# Patient Record
Sex: Male | Born: 1950 | Race: White | Hispanic: No | Marital: Married | Smoking: Current every day smoker
Health system: Southern US, Community
[De-identification: ages and names within clinical notes are randomized; demographics above are authoritative.]

## PROBLEM LIST (undated history)

## (undated) ENCOUNTER — Emergency Department (HOSPITAL_BASED_OUTPATIENT_CLINIC_OR_DEPARTMENT_OTHER): Admission: EM | Disposition: A | Discharge status: Home / Self Care | Payer: PRIVATE HEALTH INSURANCE

## (undated) DIAGNOSIS — I4891 Unspecified atrial fibrillation: Secondary | ICD-10-CM

## (undated) DIAGNOSIS — I340 Nonrheumatic mitral (valve) insufficiency: Secondary | ICD-10-CM

## (undated) DIAGNOSIS — I2510 Atherosclerotic heart disease of native coronary artery without angina pectoris: Secondary | ICD-10-CM

## (undated) DIAGNOSIS — I10 Essential (primary) hypertension: Secondary | ICD-10-CM

## (undated) DIAGNOSIS — M545 Low back pain, unspecified: Secondary | ICD-10-CM

## (undated) DIAGNOSIS — Z9889 Other specified postprocedural states: Secondary | ICD-10-CM

## (undated) DIAGNOSIS — E785 Hyperlipidemia, unspecified: Secondary | ICD-10-CM

## (undated) DIAGNOSIS — E669 Obesity, unspecified: Secondary | ICD-10-CM

## (undated) DIAGNOSIS — IMO0002 Reserved for concepts with insufficient information to code with codable children: Secondary | ICD-10-CM

## (undated) DIAGNOSIS — R9430 Abnormal result of cardiovascular function study, unspecified: Secondary | ICD-10-CM

## (undated) DIAGNOSIS — Z720 Tobacco use: Secondary | ICD-10-CM

## (undated) DIAGNOSIS — K922 Gastrointestinal hemorrhage, unspecified: Secondary | ICD-10-CM

## (undated) HISTORY — DX: Abnormal result of cardiovascular function study, unspecified: R94.30

## (undated) HISTORY — DX: Essential (primary) hypertension: I10

## (undated) HISTORY — DX: Tobacco use: Z72.0

## (undated) HISTORY — DX: Obesity, unspecified: E66.9

## (undated) HISTORY — PX: TONSILLECTOMY: SUR1361

## (undated) HISTORY — DX: Unspecified atrial fibrillation: I48.91

## (undated) HISTORY — DX: Nonrheumatic mitral (valve) insufficiency: I34.0

## (undated) HISTORY — PX: COLONOSCOPY: SHX174

## (undated) HISTORY — DX: Low back pain: M54.5

## (undated) HISTORY — DX: Other specified postprocedural states: Z98.890

## (undated) HISTORY — PX: OTHER SURGICAL HISTORY: SHX169

## (undated) HISTORY — DX: Hyperlipidemia, unspecified: E78.5

## (undated) HISTORY — DX: Atherosclerotic heart disease of native coronary artery without angina pectoris: I25.10

## (undated) HISTORY — DX: Low back pain, unspecified: M54.50

## (undated) HISTORY — DX: Gastrointestinal hemorrhage, unspecified: K92.2

## (undated) HISTORY — DX: Reserved for concepts with insufficient information to code with codable children: IMO0002

---

## 1998-01-16 ENCOUNTER — Emergency Department (HOSPITAL_COMMUNITY): Admission: EM | Admit: 1998-01-16 | Discharge: 1998-01-16 | Payer: Self-pay

## 1998-01-18 ENCOUNTER — Ambulatory Visit (HOSPITAL_BASED_OUTPATIENT_CLINIC_OR_DEPARTMENT_OTHER): Admission: RE | Admit: 1998-01-18 | Discharge: 1998-01-18 | Payer: Self-pay | Admitting: General Surgery

## 2000-08-10 ENCOUNTER — Ambulatory Visit (HOSPITAL_COMMUNITY): Admission: RE | Admit: 2000-08-10 | Discharge: 2000-08-10 | Payer: Self-pay | Admitting: Cardiology

## 2003-02-18 ENCOUNTER — Emergency Department (HOSPITAL_COMMUNITY): Admission: AD | Admit: 2003-02-18 | Discharge: 2003-02-18 | Payer: Self-pay | Admitting: Emergency Medicine

## 2003-06-19 ENCOUNTER — Ambulatory Visit (HOSPITAL_COMMUNITY): Admission: RE | Admit: 2003-06-19 | Discharge: 2003-06-19 | Payer: Self-pay | Admitting: Cardiovascular Disease

## 2004-01-01 ENCOUNTER — Ambulatory Visit (HOSPITAL_COMMUNITY): Admission: RE | Admit: 2004-01-01 | Discharge: 2004-01-01 | Payer: Self-pay | Admitting: Internal Medicine

## 2004-01-01 ENCOUNTER — Encounter (INDEPENDENT_AMBULATORY_CARE_PROVIDER_SITE_OTHER): Payer: Self-pay | Admitting: *Deleted

## 2004-03-21 ENCOUNTER — Ambulatory Visit: Payer: Self-pay | Admitting: Internal Medicine

## 2004-03-28 ENCOUNTER — Ambulatory Visit: Payer: Self-pay | Admitting: Internal Medicine

## 2004-04-07 ENCOUNTER — Ambulatory Visit: Payer: Self-pay

## 2004-04-18 ENCOUNTER — Emergency Department (HOSPITAL_COMMUNITY): Admission: EM | Admit: 2004-04-18 | Discharge: 2004-04-18 | Payer: Self-pay | Admitting: Emergency Medicine

## 2004-07-04 ENCOUNTER — Ambulatory Visit: Payer: Self-pay | Admitting: Cardiology

## 2004-07-25 ENCOUNTER — Ambulatory Visit: Payer: Self-pay | Admitting: Internal Medicine

## 2004-07-31 ENCOUNTER — Ambulatory Visit: Payer: Self-pay

## 2004-09-01 ENCOUNTER — Ambulatory Visit: Payer: Self-pay | Admitting: Cardiology

## 2005-05-11 DIAGNOSIS — I2510 Atherosclerotic heart disease of native coronary artery without angina pectoris: Secondary | ICD-10-CM

## 2005-05-11 HISTORY — DX: Atherosclerotic heart disease of native coronary artery without angina pectoris: I25.10

## 2005-05-11 HISTORY — PX: OTHER SURGICAL HISTORY: SHX169

## 2005-05-28 ENCOUNTER — Emergency Department (HOSPITAL_COMMUNITY): Admission: EM | Admit: 2005-05-28 | Discharge: 2005-05-28 | Payer: Self-pay | Admitting: Emergency Medicine

## 2005-05-29 ENCOUNTER — Ambulatory Visit: Payer: Self-pay | Admitting: Internal Medicine

## 2005-06-04 ENCOUNTER — Ambulatory Visit: Payer: Self-pay

## 2005-06-10 ENCOUNTER — Ambulatory Visit: Payer: Self-pay | Admitting: Cardiology

## 2005-06-19 ENCOUNTER — Ambulatory Visit: Payer: Self-pay | Admitting: Internal Medicine

## 2005-06-24 ENCOUNTER — Ambulatory Visit (HOSPITAL_COMMUNITY): Admission: RE | Admit: 2005-06-24 | Discharge: 2005-06-24 | Payer: Self-pay | Admitting: Internal Medicine

## 2005-06-24 ENCOUNTER — Ambulatory Visit: Payer: Self-pay | Admitting: Internal Medicine

## 2005-07-15 ENCOUNTER — Ambulatory Visit: Payer: Self-pay

## 2005-09-02 ENCOUNTER — Ambulatory Visit: Payer: Self-pay | Admitting: Cardiology

## 2005-10-03 ENCOUNTER — Emergency Department (HOSPITAL_COMMUNITY): Admission: EM | Admit: 2005-10-03 | Discharge: 2005-10-03 | Payer: Self-pay | Admitting: Emergency Medicine

## 2006-04-14 ENCOUNTER — Ambulatory Visit: Payer: Self-pay | Admitting: Cardiovascular Disease

## 2006-04-14 ENCOUNTER — Inpatient Hospital Stay (HOSPITAL_COMMUNITY): Admission: EM | Admit: 2006-04-14 | Discharge: 2006-04-16 | Payer: Self-pay | Admitting: Emergency Medicine

## 2006-04-15 ENCOUNTER — Encounter: Payer: Self-pay | Admitting: Cardiology

## 2006-04-21 ENCOUNTER — Ambulatory Visit: Payer: Self-pay | Admitting: Cardiology

## 2006-08-30 ENCOUNTER — Ambulatory Visit: Payer: Self-pay | Admitting: Cardiology

## 2007-02-16 DIAGNOSIS — M545 Low back pain: Secondary | ICD-10-CM | POA: Insufficient documentation

## 2008-02-02 ENCOUNTER — Ambulatory Visit: Payer: Self-pay | Admitting: Cardiology

## 2008-02-02 LAB — CONVERTED CEMR LAB
ALT: 38 units/L (ref 0–53)
AST: 24 units/L (ref 0–37)
Bilirubin, Direct: 0.2 mg/dL (ref 0.0–0.3)
CO2: 30 meq/L (ref 19–32)
Chloride: 103 meq/L (ref 96–112)
GFR calc non Af Amer: 107 mL/min
LDL Cholesterol: 67 mg/dL (ref 0–99)
Sodium: 142 meq/L (ref 135–145)
Total Bilirubin: 1.2 mg/dL (ref 0.3–1.2)
Total CHOL/HDL Ratio: 3.6

## 2008-11-19 ENCOUNTER — Emergency Department (HOSPITAL_COMMUNITY): Admission: EM | Admit: 2008-11-19 | Discharge: 2008-11-20 | Payer: Self-pay | Admitting: Emergency Medicine

## 2009-01-01 ENCOUNTER — Encounter (INDEPENDENT_AMBULATORY_CARE_PROVIDER_SITE_OTHER): Payer: Self-pay | Admitting: *Deleted

## 2009-03-22 ENCOUNTER — Encounter: Payer: Self-pay | Admitting: Cardiology

## 2009-03-22 DIAGNOSIS — F17200 Nicotine dependence, unspecified, uncomplicated: Secondary | ICD-10-CM | POA: Insufficient documentation

## 2009-03-22 DIAGNOSIS — E663 Overweight: Secondary | ICD-10-CM | POA: Insufficient documentation

## 2009-03-25 ENCOUNTER — Ambulatory Visit: Payer: Self-pay | Admitting: Cardiology

## 2009-03-26 ENCOUNTER — Encounter: Payer: Self-pay | Admitting: Cardiology

## 2009-11-07 ENCOUNTER — Telehealth: Payer: Self-pay | Admitting: Cardiology

## 2009-12-16 ENCOUNTER — Encounter: Admission: RE | Admit: 2009-12-16 | Discharge: 2009-12-16 | Payer: Self-pay | Admitting: Orthopedic Surgery

## 2010-03-10 ENCOUNTER — Ambulatory Visit: Payer: Self-pay | Admitting: Emergency Medicine

## 2010-03-12 ENCOUNTER — Telehealth: Payer: Self-pay | Admitting: Family Medicine

## 2010-03-16 ENCOUNTER — Telehealth (INDEPENDENT_AMBULATORY_CARE_PROVIDER_SITE_OTHER): Payer: Self-pay | Admitting: *Deleted

## 2010-04-28 ENCOUNTER — Ambulatory Visit: Payer: Self-pay | Admitting: Family Medicine

## 2010-04-28 DIAGNOSIS — L259 Unspecified contact dermatitis, unspecified cause: Secondary | ICD-10-CM

## 2010-04-29 ENCOUNTER — Telehealth (INDEPENDENT_AMBULATORY_CARE_PROVIDER_SITE_OTHER): Payer: Self-pay | Admitting: *Deleted

## 2010-06-08 LAB — CONVERTED CEMR LAB
ALT: 49 units/L (ref 0–53)
AST: 30 units/L (ref 0–37)
Bilirubin, Direct: 0 mg/dL (ref 0.0–0.3)
Cholesterol: 111 mg/dL (ref 0–200)
LDL Cholesterol: 63 mg/dL (ref 0–99)
Total CHOL/HDL Ratio: 3
Total Protein: 6.5 g/dL (ref 6.0–8.3)
Triglycerides: 65 mg/dL (ref 0.0–149.0)

## 2010-06-12 NOTE — Progress Notes (Signed)
  Phone Note Outgoing Call Call back at [facility] Phone [phone]   Call placed by: Lajean Saver RN,  April 29, 2010 3:31 PM Call placed to: Patient Action Taken: Phone Call Completed Summary of Call: CAllback: Patient reports the rash on his feet is improved, he is using the cream as prescribed

## 2010-06-12 NOTE — Assessment & Plan Note (Signed)
Summary: RASH ON BOTH HEELS room 3   Vital Signs:  Patient Profile:   60 Years Old Male CC:      rash on heels bilaterally x5 days Height:     68 inches Weight:      252 pounds O2 Sat:      96 % O2 treatment:    Room Air Temp:     97.6 degrees F oral Pulse rate:   64 / minute Resp:     18 per minute BP sitting:   152 / 95  (left arm) Cuff size:   large  Vitals Entered By: Clemens Catholic LPN (April 28, 2010 9:44 AM)                  Updated Prior Medication List: ATENOLOL 100 MG TABS (ATENOLOL) Take 1 tablet by mouth once a day AVALIDE 150-12.5 MG TABS (IRBESARTAN-HYDROCHLOROTHIAZIDE) Take 1 tablet by mouth once a day DICLOFENAC SODIUM 75 MG TBEC (DICLOFENAC SODIUM) Take 1 tablet by mouth once daily PLAVIX 75 MG TABS (CLOPIDOGREL BISULFATE) Take one tablet by mouth daily LIPITOR 80 MG TABS (ATORVASTATIN CALCIUM) Take one tablet by mouth daily. POTASSIUM CHLORIDE CRYS CR 20 MEQ CR-TABS (POTASSIUM CHLORIDE CRYS CR) Take one tablet by mouth daily TOVIAZ 4 MG XR24H-TAB (FESOTERODINE FUMARATE)  RAPAFLO 4 MG CAPS (SILODOSIN)  KLOR-CON M20 20 MEQ CR-TABS (POTASSIUM CHLORIDE CRYS CR)  VENTOLIN HFA 108 (90 BASE) MCG/ACT AERS (ALBUTEROL SULFATE) 1-2 puffs every 6 hours as needed for shortness of breath / wheezing  Current Allergies (reviewed today): No known allergies History of Present Illness Chief Complaint: rash on heels bilaterally x5 days History of Present Illness:  Subjective:  Patient complains of onset of a pruritic rash on the backs of both heels four days ago.  The rash has persisted but not spread.  No pain.  No rash elsewhere.  He feels well otherwise.  No known contact with allergens.  No new shoes or socks.  He does report that he has been wearing sandals more often for the past several weeks.  REVIEW OF SYSTEMS Constitutional Symptoms      Denies fever, chills, night sweats, weight loss, weight gain, and fatigue.  Eyes       Denies change in vision, eye  pain, eye discharge, glasses, contact lenses, and eye surgery. Ear/Nose/Throat/Mouth       Denies hearing loss/aids, change in hearing, ear pain, ear discharge, dizziness, frequent runny nose, frequent nose bleeds, sinus problems, sore throat, hoarseness, and tooth pain or bleeding.  Respiratory       Denies dry cough, productive cough, wheezing, shortness of breath, asthma, bronchitis, and emphysema/COPD.  Cardiovascular       Denies murmurs, chest pain, and tires easily with exhertion.    Gastrointestinal       Denies stomach pain, nausea/vomiting, diarrhea, constipation, blood in bowel movements, and indigestion. Genitourniary       Denies painful urination, kidney stones, and loss of urinary control. Neurological       Denies paralysis, seizures, and fainting/blackouts. Musculoskeletal       Denies muscle pain, joint pain, joint stiffness, decreased range of motion, redness, swelling, muscle weakness, and gout.  Skin       Denies bruising, unusual mles/lumps or sores, and hair/skin or nail changes.      Comments: bilateral heels Psych       Denies mood changes, temper/anger issues, anxiety/stress, speech problems, depression, and sleep problems. Other Comments: pt c/o rash on heels bilaterally  x 5 days. pt states that his heels itch. he has tried OTC Gold bond cream, no help.   Past History:  Past Medical History: Reviewed history from 03/25/2009 and no changes required. Atrial fibrillation..patient refused Coumadin...rate is slow Coumadin... patient refused CAD   DES.. OM.. 2007 Tobacco abuse Mitral regurgitation... mild... echo... December, 2007 Dyslipidemia... low HDL Hypertension Obesity Low back pain EF   60%... echo... December, 2007  Past Surgical History: Reviewed history from 03/10/2010 and no changes required. Reattach L Index Finger Colonoscopy Heart stint, 2007  Family History: Reviewed history from 03/10/2010 and no changes required. Adopted  unk  Social History: Reviewed history from 03/10/2010 and no changes required. Occupation: Married Current Smoker-4-5 cig perday, 40 yrs Alcohol use-no Drug use-no   Objective:  No acute distress  Feet:  Minimal erythema posteriorly over calcaneus bilaterally, extending upwards somewhat over the achilles tendons.  No tenderness, swelling, or warmth.  No evidence cellulitis Assessment New Problems: DERMATITIS, FEET (ICD-692.9)  SUSPECT A MILD CONTACT DERMATITIS  Plan New Medications/Changes: TRIAMCINOLONE ACETONIDE 0.1 % CREA (TRIAMCINOLONE ACETONIDE) Apply thin layer to affected area two times a day to three times a day  #30gm x 0, 04/28/2010, Donna Christen MD  New Orders: Est. Patient Level III [phone] Planning Comments:   Begin triamcinolone cream two times a day to three times a day. Recommend dermatolgy evaluation if not improving 7 to 10 days.  Return if develops swelling, pain, heat, etc.   The patient and/or caregiver has been counseled thoroughly with regard to medications prescribed including dosage, schedule, interactions, rationale for use, and possible side effects and they verbalize understanding.  Diagnoses and expected course of recovery discussed and will return if not improved as expected or if the condition worsens. Patient and/or caregiver verbalized understanding.  Prescriptions: TRIAMCINOLONE ACETONIDE 0.1 % CREA (TRIAMCINOLONE ACETONIDE) Apply thin layer to affected area two times a day to three times a day  #30gm x 0   Entered and Authorized by:   Donna Christen MD   Signed by:   Donna Christen MD on 04/28/2010   Method used:   Print then Give to Patient   RxID:   [id number]   Orders Added: 1)  Est. Patient Level III [41660]

## 2010-06-12 NOTE — Assessment & Plan Note (Signed)
Summary: POSSIBLE URI? NH   Vital Signs:  Patient Profile:   60 Years Old Male CC:      Runny nose, cough, sneeezing x 3 days Height:     68 inches Weight:      246 pounds O2 Sat:      95 % O2 treatment:    Room Air Temp:     98.0 degrees F oral Pulse rate:   54 / minute Pulse rhythm:   regular Resp:     18 per minute BP sitting:   144 / 79  (left arm) Cuff size:   large  Vitals Entered By: Emilio Math (March 10, 2010 11:20 AM)                  Current Allergies (reviewed today): No known allergies History of Present Illness History from: patient Chief Complaint: Runny nose, cough, sneeezing x 3 days History of Present Illness: Patient complains of onset of cold symptoms for 3-4 days.  They have been using no OTC meds. No sore throat + cough No pleuritic pain No wheezing + nasal congestion + post-nasal drainage + sinus pain/pressure No itchy/red eyes No earache No hemoptysis + SOB (wheeze) No chest pain No chills/sweats No fever No nausea No vomiting No abdominal pain No diarrhea No skin rashes + fatigue No myalgias No headache   Current Meds ATENOLOL 100 MG TABS (ATENOLOL) Take 1 tablet by mouth once a day AVALIDE 150-12.5 MG TABS (IRBESARTAN-HYDROCHLOROTHIAZIDE) Take 1 tablet by mouth once a day DICLOFENAC SODIUM 75 MG TBEC (DICLOFENAC SODIUM) Take 1 tablet by mouth once daily PLAVIX 75 MG TABS (CLOPIDOGREL BISULFATE) Take one tablet by mouth daily LIPITOR 80 MG TABS (ATORVASTATIN CALCIUM) Take one tablet by mouth daily. POTASSIUM CHLORIDE CRYS CR 20 MEQ CR-TABS (POTASSIUM CHLORIDE CRYS CR) Take one tablet by mouth daily TOVIAZ 4 MG XR24H-TAB (FESOTERODINE FUMARATE)  RAPAFLO 4 MG CAPS (SILODOSIN)  KLOR-CON M20 20 MEQ CR-TABS (POTASSIUM CHLORIDE CRYS CR)  AMOXICILLIN 875 MG TABS (AMOXICILLIN) 1 tab by mouth two times a day for 7 days VENTOLIN HFA 108 (90 BASE) MCG/ACT AERS (ALBUTEROL SULFATE) 1-2 puffs every 6 hours as needed for shortness of  breath / wheezing  REVIEW OF SYSTEMS Constitutional Symptoms      Denies fever, chills, night sweats, weight loss, weight gain, and fatigue.  Eyes       Denies change in vision, eye pain, eye discharge, glasses, contact lenses, and eye surgery. Ear/Nose/Throat/Mouth       Complains of frequent runny nose, sinus problems, and hoarseness.      Denies hearing loss/aids, change in hearing, ear pain, ear discharge, dizziness, frequent nose bleeds, sore throat, and tooth pain or bleeding.  Respiratory       Complains of dry cough, wheezing, and shortness of breath.      Denies productive cough, asthma, bronchitis, and emphysema/COPD.  Cardiovascular       Denies murmurs, chest pain, and tires easily with exhertion.    Gastrointestinal       Denies stomach pain, nausea/vomiting, diarrhea, constipation, blood in bowel movements, and indigestion. Genitourniary       Denies painful urination, kidney stones, and loss of urinary control. Neurological       Denies paralysis, seizures, and fainting/blackouts. Musculoskeletal       Denies muscle pain, joint pain, joint stiffness, decreased range of motion, redness, swelling, muscle weakness, and gout.  Skin       Denies bruising, unusual mles/lumps or sores,  and hair/skin or nail changes.  Psych       Denies mood changes, temper/anger issues, anxiety/stress, speech problems, depression, and sleep problems.  Past History:  Past Medical History: Reviewed history from 03/25/2009 and no changes required. Atrial fibrillation..patient refused Coumadin...rate is slow Coumadin... patient refused CAD   DES.. OM.. 2007 Tobacco abuse Mitral regurgitation... mild... echo... December, 2007 Dyslipidemia... low HDL Hypertension Obesity Low back pain EF   60%... echo... December, 2007  Past Surgical History: Reattach L Index Finger Colonoscopy Heart stint, 2007  Family History: Reviewed history and no changes required. Adopted unk  Social  History: Reviewed history from 02/16/2007 and no changes required. Occupation: Married Current Smoker-4-5 cig perday, 40 yrs Alcohol use-no Drug use-no Physical Exam General appearance: well developed, well nourished, mild coughing and bronchial wheezing, older than stated age Ears: normal, no lesions or deformities Nasal: clear discharge Oral/Pharynx: tongue normal, posterior pharynx without erythema or exudate Neck: neck supple,  trachea midline, no masses Chest/Lungs: no rales or rhonchi bilateral, breath sounds equal without effort Heart: Irregular rate and rhythm Skin: no obvious rashes or lesions MSE: oriented to time, place, and person Assessment New Problems: UPPER RESPIRATORY INFECTION, ACUTE (ICD-465.9)   Patient Education: Patient and/or caregiver instructed in the following: quit smoking.  Plan New Medications/Changes: VENTOLIN HFA 108 (90 BASE) MCG/ACT AERS (ALBUTEROL SULFATE) 1-2 puffs every 6 hours as needed for shortness of breath / wheezing  #1 x 1, 03/10/2010, Hoyt Koch MD AMOXICILLIN 875 MG TABS (AMOXICILLIN) 1 tab by mouth two times a day for 7 days  #14 x 0, 03/10/2010, Hoyt Koch MD  New Orders: New Patient Level III [phone] Planning Comments:   1)  Take the prescribed antibiotic as instructed. 2)  Use nasal saline solution (over the counter) at least 3 times a day. 3)  Do not use over the counter decongestants because of your heart problems 4)  Can take tylenol every 6 hours or motrin every 8 hours for pain or fever. 5)  Follow up with your primary doctor  if no improvement in 5-7 days, sooner if increasing pain, fever, or new symptoms.   6) Discussed with patient that if he is not getting better in 3-5 days, can call and I will Rx Prednisone 6-day 10mg  pack   The patient and/or caregiver has been counseled thoroughly with regard to medications prescribed including dosage, schedule, interactions, rationale for use, and possible side  effects and they verbalize understanding.  Diagnoses and expected course of recovery discussed and will return if not improved as expected or if the condition worsens. Patient and/or caregiver verbalized understanding.  Prescriptions: VENTOLIN HFA 108 (90 BASE) MCG/ACT AERS (ALBUTEROL SULFATE) 1-2 puffs every 6 hours as needed for shortness of breath / wheezing  #1 x 1   Entered and Authorized by:   Hoyt Koch MD   Signed by:   Hoyt Koch MD on 03/10/2010   Method used:   Print then Give to Patient   RxID:   [id number] AMOXICILLIN 875 MG TABS (AMOXICILLIN) 1 tab by mouth two times a day for 7 days  #14 x 0   Entered and Authorized by:   Hoyt Koch MD   Signed by:   Hoyt Koch MD on 03/10/2010   Method used:   Print then Give to Patient   RxID:   [id number]   Orders Added: 1)  New Patient Level III [29528]

## 2010-06-12 NOTE — Progress Notes (Signed)
  Phone Note Call from Patient   Caller: Patient Summary of Call: Pt cllng req Prednisone be clld into [facility]. Pt stated prescription was discussed w/Dr. Orson Aloe. Discussion is notated on 03/10/10 visit. Pls Advise. Initial call taken by: Areta Haber CMA,  March 12, 2010 1:29 PM    New/Updated Medications: PREDNISONE 10 MG TABS (PREDNISONE) 2 PO BID for 2 days, then 1 BID for 2 days, then 1 daily for 2 days.  Take PC Prescriptions: PREDNISONE 10 MG TABS (PREDNISONE) 2 PO BID for 2 days, then 1 BID for 2 days, then 1 daily for 2 days.  Take PC  #14 x 0   Entered and Authorized by:   Donna Christen MD   Signed by:   Donna Christen MD on 03/12/2010   Method used:   Electronically to        [facility]* (retail)       [address] [place], [place]  [address]       Ph: [phone]       Fax: [phone]   RxID:   [id number]  Rx sent Donna Christen MD  March 12, 2010 2:49 PM    Clld pt to advise the above mentioned medication has been faxed to [facility] [place] - LMOVM of pts hm voice mail. Areta Haber CMA  March 13, 2010 9:36 AM

## 2010-06-12 NOTE — Progress Notes (Signed)
  Phone Note Outgoing Call Call back at [facility] Phone [phone]   Call placed by: Lajean Saver RN,  March 16, 2010 1:32 PM Call placed to: Patient Action Taken: Phone Call Completed Summary of Call: Callback: patient reports he is feeling better. He is taking prednisone and his antibiotic as prescribed.

## 2010-06-12 NOTE — Progress Notes (Signed)
Summary: lipitor   Phone Note Other Incoming   Summary of Call: [facility] pt wanted to change Lipitor 80 to a generic per Dr Myrtis Ser would prefer not to change b/c nothing else is comparable, called and discussed w/pt will mail him a $4 copay card and new prescription  Initial call taken by: Meredith Staggers, RN,  November 07, 2009 5:29 PM    Prescriptions: LIPITOR 80 MG TABS (ATORVASTATIN CALCIUM) Take one tablet by mouth daily.  #30 x 12   Entered by:   Meredith Staggers, RN   Authorized by:   Talitha Givens, MD, [facility]   Signed by:   Meredith Staggers, RN on 11/07/2009   Method used:   Print then Give to Patient   RxID:   [id number]

## 2010-07-04 ENCOUNTER — Ambulatory Visit (INDEPENDENT_AMBULATORY_CARE_PROVIDER_SITE_OTHER): Payer: Self-pay | Admitting: Emergency Medicine

## 2010-07-04 ENCOUNTER — Encounter: Payer: Self-pay | Admitting: Emergency Medicine

## 2010-07-04 DIAGNOSIS — S2341XA Sprain of ribs, initial encounter: Secondary | ICD-10-CM | POA: Insufficient documentation

## 2010-07-08 NOTE — Assessment & Plan Note (Signed)
Summary: PULLED MUSCLE IN RIB CAGE (rm 3)   Vital Signs:  Patient Profile:   60 Years Old Male CC:      pain in right side/ribs x 3 days post fall Height:     68 inches Weight:      259 pounds O2 Sat:      93 % O2 treatment:    Room Air Temp:     97.6 degrees F oral Pulse rate:   70 / minute Resp:     16 per minute BP sitting:   135 / 79  (left arm) Cuff size:   large  Vitals Entered By: Lajean Saver RN (July 04, 2010 10:24 AM)                  Updated Prior Medication List: ATENOLOL 100 MG TABS (ATENOLOL) Take 1 tablet by mouth once a day AVALIDE 150-12.5 MG TABS (IRBESARTAN-HYDROCHLOROTHIAZIDE) Take 1 tablet by mouth once a day DICLOFENAC SODIUM 75 MG TBEC (DICLOFENAC SODIUM) Take 1 tablet by mouth once daily PLAVIX 75 MG TABS (CLOPIDOGREL BISULFATE) Take one tablet by mouth daily LIPITOR 80 MG TABS (ATORVASTATIN CALCIUM) Take one tablet by mouth daily. POTASSIUM CHLORIDE CRYS CR 20 MEQ CR-TABS (POTASSIUM CHLORIDE CRYS CR) Take one tablet by mouth daily TOVIAZ 4 MG XR24H-TAB (FESOTERODINE FUMARATE)  RAPAFLO 4 MG CAPS (SILODOSIN)  KLOR-CON M20 20 MEQ CR-TABS (POTASSIUM CHLORIDE CRYS CR)  VENTOLIN HFA 108 (90 BASE) MCG/ACT AERS (ALBUTEROL SULFATE) 1-2 puffs every 6 hours as needed for shortness of breath / wheezing TRIAMCINOLONE ACETONIDE 0.1 % CREA (TRIAMCINOLONE ACETONIDE) Apply thin layer to affected area two times a day to three times a day  Current Allergies: No known allergies History of Present Illness History from: patient Chief Complaint: pain in right side/ribs x 3 days post fall History of Present Illness: He tripped over a dog a few days ago but didn't fall.  No pain initially but later that evening began to have sharp intermittant pain lower R chest, worse with lifting and pulling and certain movements.  Exedrin doesn't help.  Hasn't iced.  Had something similar on the L side a long time ago.  REVIEW OF SYSTEMS Constitutional Symptoms      Denies  fever, chills, night sweats, weight loss, weight gain, and fatigue.  Eyes       Denies change in vision, eye pain, eye discharge, glasses, contact lenses, and eye surgery. Ear/Nose/Throat/Mouth       Denies hearing loss/aids, change in hearing, ear pain, ear discharge, dizziness, frequent runny nose, frequent nose bleeds, sinus problems, sore throat, hoarseness, and tooth pain or bleeding.  Respiratory       Denies dry cough, productive cough, wheezing, shortness of breath, asthma, bronchitis, and emphysema/COPD.  Cardiovascular       Denies murmurs, chest pain, and tires easily with exhertion.    Gastrointestinal       Denies stomach pain, nausea/vomiting, diarrhea, constipation, blood in bowel movements, and indigestion. Genitourniary       Denies painful urination, kidney stones, and loss of urinary control. Neurological       Denies paralysis, seizures, and fainting/blackouts. Musculoskeletal       Complains of muscle pain and decreased range of motion.      Denies joint pain, joint stiffness, redness, swelling, muscle weakness, and gout.      Comments: right side/ribs Skin       Denies bruising, unusual mles/lumps or sores, and hair/skin or nail changes.  Psych  Denies mood changes, temper/anger issues, anxiety/stress, speech problems, depression, and sleep problems. Other Comments: Patient tripped over his dog 3 days ago causing him to fall. He has pain in his right side since   Past History:  Past Medical History: Reviewed history from 03/25/2009 and no changes required. Atrial fibrillation..patient refused Coumadin...rate is slow Coumadin... patient refused CAD   DES.. OM.. 2007 Tobacco abuse Mitral regurgitation... mild... echo... December, 2007 Dyslipidemia... low HDL Hypertension Obesity Low back pain EF   60%... echo... December, 2007  Past Surgical History: Reviewed history from 03/10/2010 and no changes required. Reattach L Index Finger Colonoscopy Heart  stint, 2007  Family History: Adopted   Social History:  Married Current Smoker- 1 perday, 40 yrs Alcohol use-no Drug use-no Physical Exam General appearance: well developed, well nourished, no acute distress Head: normocephalic, atraumatic Chest/Lungs: no rales, wheezes, or rhonchi bilateral, breath sounds equal without effort Skin: no obvious rashes or lesions MSE: oriented to time, place, and person TTP anterior axillary line around the 10th rib.   Assessment New Problems: MUSCLE STRAIN, INTERCOSTAL (ICD-848.3)   Plan New Medications/Changes: PREDNISONE (PAK) 10 MG TABS (PREDNISONE) 6 day pack, use as directed  #1 x 0, 07/04/2010, Hoyt Koch MD ULTRACET 37.5-325 MG TABS (TRAMADOL-ACETAMINOPHEN) 1 by mouth q6 hrs as needed for pain  #20 x 0, 07/04/2010, Hoyt Koch MD  New Orders: Est. Patient Level III [phone] Planning Comments:   Patient doesn't want to have an Xray.  No falling or trauma, so doubt broken rib.  Lung sounds clear.  Avoid lifting, pushing, pullling for a week.  Use meds as directed.  Ice area frequently.  If not improving by next week, consider Xray or sent to PT.   The patient and/or caregiver has been counseled thoroughly with regard to medications prescribed including dosage, schedule, interactions, rationale for use, and possible side effects and they verbalize understanding.  Diagnoses and expected course of recovery discussed and will return if not improved as expected or if the condition worsens. Patient and/or caregiver verbalized understanding.  Prescriptions: PREDNISONE (PAK) 10 MG TABS (PREDNISONE) 6 day pack, use as directed  #1 x 0   Entered and Authorized by:   Hoyt Koch MD   Signed by:   Hoyt Koch MD on 07/04/2010   Method used:   Print then Give to Patient   RxID:   [id number] ULTRACET 37.5-325 MG TABS (TRAMADOL-ACETAMINOPHEN) 1 by mouth q6 hrs as needed for pain  #20 x 0   Entered and Authorized by:    Hoyt Koch MD   Signed by:   Hoyt Koch MD on 07/04/2010   Method used:   Print then Give to Patient   RxID:   [id number]   Orders Added: 1)  Est. Patient Level III [29528]

## 2010-07-19 ENCOUNTER — Ambulatory Visit (INDEPENDENT_AMBULATORY_CARE_PROVIDER_SITE_OTHER): Payer: PRIVATE HEALTH INSURANCE | Admitting: Emergency Medicine

## 2010-07-19 ENCOUNTER — Encounter: Payer: Self-pay | Admitting: Emergency Medicine

## 2010-07-19 ENCOUNTER — Inpatient Hospital Stay (HOSPITAL_COMMUNITY)
Admission: AD | Admit: 2010-07-19 | Discharge: 2010-07-21 | DRG: 379 | Disposition: A | Discharge status: Home / Self Care | Payer: PRIVATE HEALTH INSURANCE | Source: Other Acute Inpatient Hospital | Attending: Internal Medicine | Admitting: Internal Medicine

## 2010-07-19 DIAGNOSIS — I2510 Atherosclerotic heart disease of native coronary artery without angina pectoris: Secondary | ICD-10-CM | POA: Diagnosis present

## 2010-07-19 DIAGNOSIS — J449 Chronic obstructive pulmonary disease, unspecified: Secondary | ICD-10-CM | POA: Diagnosis present

## 2010-07-19 DIAGNOSIS — N400 Enlarged prostate without lower urinary tract symptoms: Secondary | ICD-10-CM | POA: Diagnosis present

## 2010-07-19 DIAGNOSIS — J4489 Other specified chronic obstructive pulmonary disease: Secondary | ICD-10-CM | POA: Diagnosis present

## 2010-07-19 DIAGNOSIS — I252 Old myocardial infarction: Secondary | ICD-10-CM

## 2010-07-19 DIAGNOSIS — I4891 Unspecified atrial fibrillation: Secondary | ICD-10-CM | POA: Diagnosis present

## 2010-07-19 DIAGNOSIS — F17200 Nicotine dependence, unspecified, uncomplicated: Secondary | ICD-10-CM | POA: Diagnosis present

## 2010-07-19 DIAGNOSIS — K260 Acute duodenal ulcer with hemorrhage: Principal | ICD-10-CM | POA: Diagnosis present

## 2010-07-19 DIAGNOSIS — K922 Gastrointestinal hemorrhage, unspecified: Secondary | ICD-10-CM | POA: Insufficient documentation

## 2010-07-19 DIAGNOSIS — Z7902 Long term (current) use of antithrombotics/antiplatelets: Secondary | ICD-10-CM

## 2010-07-19 LAB — LIPID PANEL
Cholesterol: 95 mg/dL (ref 0–200)
HDL: 28 mg/dL — ABNORMAL LOW (ref >39)
LDL Cholesterol: 36 mg/dL (ref 0–99)
Total CHOL/HDL Ratio: 3.4 RATIO
Triglycerides: 154 mg/dL — ABNORMAL HIGH (ref <150)

## 2010-07-19 LAB — DIFFERENTIAL
Basophils Absolute: 0.1 10*3/uL (ref 0.0–0.1)
Eosinophils Relative: 4 % (ref 0–5)
Lymphocytes Relative: 22 % (ref 12–46)
Lymphs Abs: 1.9 10*3/uL (ref 0.7–4.0)
Neutro Abs: 5.8 10*3/uL (ref 1.7–7.7)

## 2010-07-19 LAB — COMPREHENSIVE METABOLIC PANEL
Albumin: 3 g/dL — ABNORMAL LOW (ref 3.5–5.2)
BUN: 21 mg/dL (ref 6–23)
Calcium: 8.3 mg/dL — ABNORMAL LOW (ref 8.4–10.5)
Glucose, Bld: 91 mg/dL (ref 70–99)
Total Protein: 5 g/dL — ABNORMAL LOW (ref 6.0–8.3)

## 2010-07-19 LAB — CBC
HCT: 34.4 % — ABNORMAL LOW (ref 39.0–52.0)
Hemoglobin: 11.5 g/dL — ABNORMAL LOW (ref 13.0–17.0)
MCV: 93.5 fL (ref 78.0–100.0)
RDW: 14.1 % (ref 11.5–15.5)
WBC: 8.7 10*3/uL (ref 4.0–10.5)

## 2010-07-19 LAB — PROTIME-INR: INR: 1.03 (ref 0.00–1.49)

## 2010-07-19 LAB — APTT: aPTT: 25 seconds (ref 24–37)

## 2010-07-20 ENCOUNTER — Other Ambulatory Visit: Payer: Self-pay | Admitting: Gastroenterology

## 2010-07-20 ENCOUNTER — Telehealth (INDEPENDENT_AMBULATORY_CARE_PROVIDER_SITE_OTHER): Payer: Self-pay

## 2010-07-20 LAB — CBC
HCT: 32.7 % — ABNORMAL LOW (ref 39.0–52.0)
Hemoglobin: 11 g/dL — ABNORMAL LOW (ref 13.0–17.0)
MCH: 31.7 pg (ref 26.0–34.0)
MCHC: 33.6 g/dL (ref 30.0–36.0)
MCV: 94.2 fL (ref 78.0–100.0)

## 2010-07-20 LAB — BASIC METABOLIC PANEL
BUN: 17 mg/dL (ref 6–23)
CO2: 29 mEq/L (ref 19–32)
Calcium: 8.2 mg/dL — ABNORMAL LOW (ref 8.4–10.5)
Glucose, Bld: 91 mg/dL (ref 70–99)
Sodium: 141 mEq/L (ref 135–145)

## 2010-07-21 ENCOUNTER — Telehealth (INDEPENDENT_AMBULATORY_CARE_PROVIDER_SITE_OTHER): Payer: Self-pay | Admitting: *Deleted

## 2010-07-21 LAB — CBC
Hemoglobin: 11.1 g/dL — ABNORMAL LOW (ref 13.0–17.0)
MCH: 31.3 pg (ref 26.0–34.0)
MCHC: 33.5 g/dL (ref 30.0–36.0)

## 2010-07-21 LAB — BASIC METABOLIC PANEL
CO2: 32 mEq/L (ref 19–32)
Calcium: 8.5 mg/dL (ref 8.4–10.5)
Creatinine, Ser: 0.76 mg/dL (ref 0.4–1.5)
GFR calc Af Amer: >60 mL/min (ref >60)
Glucose, Bld: 114 mg/dL — ABNORMAL HIGH (ref 70–99)

## 2010-07-22 NOTE — Discharge Summary (Signed)
[name], [name]               ACCOUNT NO.:  [id number]  MEDICAL RECORD NO.:  [id number]           PATIENT TYPE:  I  LOCATION:  5525                         FACILITY:  [facility]  PHYSICIAN:  Brendia Sacks, MD    DATE OF BIRTH:  07-07-1950  DATE OF ADMISSION:  07/19/2010 DATE OF DISCHARGE:  07/21/2010                              DISCHARGE SUMMARY   PRIMARY CARE PHYSICIAN:  Currently none.  He will be following up with Dr. Matthias Hughs.  PRIMARY GASTROENTEROLOGIST:  Bernette Redbird, MD  CONDITION ON DISCHARGE:  Improved.  DISCHARGE DIAGNOSES: 1. Acute upper gastrointestinal bleed. 2. Antral ulcer and duodenal ulcer, presumably source. 3. Atrial fibrillation, stable.  The patient has refused Coumadin in     the past. 4. History of coronary artery disease. 5. Cigarette smoker.  HISTORY OF PRESENT ILLNESS:  This is a 60 year old man who presented to the emergency room with black stools and weakness.  HOSPITAL COURSE: 1. Upper GI bleed.  The patient was seen consultation with Dr.     Matthias Hughs.  He placed on PPI therapy and underwent an EGD with     results as described below.  It was recommended that aspirin be     discontinued for at least 3 weeks but that Plavix could be started     on discharge.  He will follow up with Dr. Matthias Hughs in the office in     about 1 week and use Carafate for 1 week. 2. Atrial fibrillation.  This has remained stable and he continues on     atenolol.  He has refused Coumadin in the past. 3. History of coronary artery disease.  This has remained stable.  He     continues on Plavix.  Aspirin is on hold as described above. 4. Cigarette smoker.  Recommend cessation.  CONSULTATIONS:  Gastroenterology.  Recommendations as above.  PROCEDURES:  EGD, March 11:  No active bleeding or blood within the stomach at that time of exam.  Antral ulcer.  Clean based duodenal ulcer.  Recommend follow up EGD in 2 months.  Note, the patient should be on PPI therapy and is  on aspirin.  IMAGING:  None.  PERTINENT LABORATORY STUDIES: 1. Hemoglobin A1c 5.9. 2. TSH 1.224. 3. CBC; hemoglobin stable at 11.1. 4. Basic metabolic panel essentially unremarkable. 5. Hepatic function panel unremarkable.  PHYSICAL EXAMINATION ON DISCHARGE:  GENERAL:  The patient is feeling well.  No complaints.  No bleeding. CARDIOVASCULAR:  Regular rate and rhythm.  No murmur rub, or gallop. RESPIRATORY:  Clear to auscultation bilaterally.  No wheezes, rales, or rhonchi.  Normal respiratory effort. VITAL SIGNS:  Temperature is 97.6, pulse 69, respirations 15, blood pressure 106/67, and saturating 91% on room air.  DISCHARGE INSTRUCTIONS:  The patient will be discharged home today. Diet is a heart-healthy diet.  Activities unrestricted.  Follow up with Dr. Matthias Hughs 1 week.  No aspirin, Aleve, BC or Goody Powders, no Excedrin, no ibuprofen.  DISCHARGE MEDICATIONS: 1. Atenolol 100 mg p.o. daily. 2. Lipitor home dose as directed daily. 3. Plavix 75 mg p.o. daily. 4. Protonix 40 mg p.o. b.i.d. 5. Rapaflo  8 mg p.o. daily. 6. Sucralfate 1 g p.o. q.a.c. and at bedtime for 1 week. 7. Toviaz 4 mg p.o. daily. 8. Multivitamin p.o. daily. 9. Vitamin C 500 mg p.o. daily.  Discontinue aspirin until cleared by Dr. Matthias Hughs.  Pathology from EGD is pending.  TIME COORDINATING DISCHARGE:  20 minutes.     Brendia Sacks, MD     DG/MEDQ  D:  07/21/2010  T:  07/22/2010  Job:  [id number]  cc:   Bernette Redbird, M.D.  Electronically Signed by Brendia Sacks  on 07/22/2010 11:35:52 AM

## 2010-07-22 NOTE — Assessment & Plan Note (Signed)
Summary: BLACK STOOLS X3 DAYS/WSE(rm 3)   Vital Signs:  Patient Profile:   60 Years Old Male CC:      black tarry stool x3 days Height:     68 inches Weight:      247.4 pounds O2 Sat:      97 % O2 treatment:    Room Air Temp:     97.8 degrees F oral Pulse rate:   75 / minute Resp:     18 per minute BP sitting:   104 / 71  (left arm) Cuff size:   large  Vitals Entered By: Linton Flemings RN (July 19, 2010 11:46 AM)                  Updated Prior Medication List: ATENOLOL 100 MG TABS (ATENOLOL) Take 1 tablet by mouth once a day AVALIDE 150-12.5 MG TABS (IRBESARTAN-HYDROCHLOROTHIAZIDE) Take 1 tablet by mouth once a day DICLOFENAC SODIUM 75 MG TBEC (DICLOFENAC SODIUM) Take 1 tablet by mouth once daily PLAVIX 75 MG TABS (CLOPIDOGREL BISULFATE) Take one tablet by mouth daily LIPITOR 80 MG TABS (ATORVASTATIN CALCIUM) Take one tablet by mouth daily. POTASSIUM CHLORIDE CRYS CR 20 MEQ CR-TABS (POTASSIUM CHLORIDE CRYS CR) Take one tablet by mouth daily TOVIAZ 4 MG XR24H-TAB (FESOTERODINE FUMARATE)  RAPAFLO 4 MG CAPS (SILODOSIN)  KLOR-CON M20 20 MEQ CR-TABS (POTASSIUM CHLORIDE CRYS CR)  VENTOLIN HFA 108 (90 BASE) MCG/ACT AERS (ALBUTEROL SULFATE) 1-2 puffs every 6 hours as needed for shortness of breath / wheezing TRIAMCINOLONE ACETONIDE 0.1 % CREA (TRIAMCINOLONE ACETONIDE) Apply thin layer to affected area two times a day to three times a day ULTRACET 37.5-325 MG TABS (TRAMADOL-ACETAMINOPHEN) 1 by mouth q6 hrs as needed for pain PREDNISONE (PAK) 10 MG TABS (PREDNISONE) 6 day pack, use as directed  Current Allergies: No known allergies History of Present Illness History from: patient & wife Chief Complaint: black tarry stool x3 days History of Present Illness: Black tarry stools for 3 days.  No blood or streaking.  He hasn't had this before.  No abdominal pain, but mild nausea that has improved.  No vomiting, fever, chills.  He feels fatigued.  He has heartburn in the past but  doesn't take anything.  REVIEW OF SYSTEMS Constitutional Symptoms      Denies fever, chills, night sweats, weight loss, weight gain, and fatigue.  Eyes       Denies change in vision, eye pain, eye discharge, glasses, contact lenses, and eye surgery. Ear/Nose/Throat/Mouth       Denies hearing loss/aids, change in hearing, ear pain, ear discharge, dizziness, frequent runny nose, frequent nose bleeds, sinus problems, sore throat, hoarseness, and tooth pain or bleeding.  Respiratory       Denies dry cough, productive cough, wheezing, shortness of breath, asthma, bronchitis, and emphysema/COPD.  Cardiovascular       Denies murmurs, chest pain, and tires easily with exhertion.    Gastrointestinal       Complains of diarrhea.      Denies stomach pain, nausea/vomiting, constipation, blood in bowel movements, and indigestion. Genitourniary       Denies painful urination, kidney stones, and loss of urinary control. Neurological       Complains of weakness.      Denies headaches, loss of or changes in sensation, numbness, tngling, tremors, paralysis, seizures, and fainting/blackouts. Musculoskeletal       Denies muscle pain, joint pain, joint stiffness, decreased range of motion, redness, swelling, muscle weakness, and gout.  Skin  Denies bruising, unusual mles/lumps or sores, and hair/skin or nail changes.  Psych       Denies mood changes, temper/anger issues, anxiety/stress, speech problems, depression, and sleep problems. Other Comments: started Thursday with being real weak, loose stool 3-4 a day, subsided to one a day.  denies n/v at present, hx of hemorrhoids   Past History:  Past Medical History: Reviewed history from 03/25/2009 and no changes required. Atrial fibrillation..patient refused Coumadin...rate is slow Coumadin... patient refused CAD   DES.. OM.. 2007 Tobacco abuse Mitral regurgitation... mild... echo... December, 2007 Dyslipidemia... low  HDL Hypertension Obesity Low back pain EF   60%... echo... December, 2007  Past Surgical History: Reviewed history from 03/10/2010 and no changes required. Reattach L Index Finger Colonoscopy Heart stint, 2007  Family History: Reviewed history from 07/04/2010 and no changes required. Adopted   Social History: Reviewed history from 07/04/2010 and no changes required.  Married Current Smoker- 1 perday, 40 yrs Alcohol use-no Drug use-no Physical Exam General appearance: well developed, well nourished, no acute distress Chest/Lungs: no rales, wheezes, or rhonchi bilateral, breath sounds equal without effort Heart: regular rate and  rhythm, no murmur Abdomen: soft, non-tender without obvious organomegaly MSE: oriented to time, place, and person Hemoccult + Assessment New Problems: UPPER GASTROINTESTINAL HEMORRHAGE (ICD-578.9)   Patient Education: Patient and/or caregiver instructed in the following: rest, fluids.  Plan New Orders: Est. Patient Level IV [99214] T-CBC w/Diff [[id number]] Hemoccult Guaiac-1 spec.(in office) [82270] Planning Comments:   Hemoccult card is + CBC shows Hgb of 13.9, WBC 11.5 with left shift Normally has HTN, today is low. From his history and above labs and from being on blood thinners, I feel this is a bleeding peptic ulcer.  After discussing it with him and knowing today is Saturday and no GI offices are open, it was decided that it is best to go to the ER.  I feel he will need GI evaluation, a upper endoscopy today, and possibly even inpatient management.  Do not drink or eat until approved by the ER.  I sent him with a copy of his vitals and labs.  The patient and his wife agree.  He is stable for his wife to transport him to [place] ER right now.   The patient and/or caregiver has been counseled thoroughly with regard to medications prescribed including dosage, schedule, interactions, rationale for use, and possible side effects and  they verbalize understanding.  Diagnoses and expected course of recovery discussed and will return if not improved as expected or if the condition worsens. Patient and/or caregiver verbalized understanding.   Orders Added: 1)  Est. Patient Level IV [09811] 2)  T-CBC w/Diff [[id number]] 3)  Hemoccult Guaiac-1 spec.(in office) [82270]

## 2010-07-23 NOTE — H&P (Signed)
[name], [name]               ACCOUNT NO.:  [id number]  MEDICAL RECORD NO.:  [id number]           PATIENT TYPE:  I  LOCATION:  5525                         FACILITY:  [facility]  PHYSICIAN:  Mariea Stable, MD   DATE OF BIRTH:  09-14-1950  DATE OF ADMISSION:  07/19/2010 DATE OF DISCHARGE:                             HISTORY & PHYSICAL   PRIMARY CARE PHYSICIAN:  Currently none.  The patient follows with Dr. Myrtis Ser, Cardiology, although has not seen him about a year.  CHIEF COMPLAINT:  Black stools and weakness.  HISTORY OF PRESENT ILLNESS:  [name] is a 60 year old gentleman with past medical history significant for coronary artery disease, status post Taxus drug-eluting stent to high-grade lesion in the obtuse marginal branch along with atrial fibrillation, on chronic aspirin and Plavix, who presents with approximately 3 or 4 days of black stools and weakness.  He states that symptoms started Thursday prior to admission, where he describes feeling lousy.  He states he had multiple bowel movements probably over a dozen that consisted of black tarry stools. He denies any over blood in any of the bowel movements.  He had some associated nausea and generalized weakness associated with this.  He also reports having decreased appetite, although he was finally able to tolerate some chicken and rice soup last night.  Because of the symptoms he went to [facility] where he was then transferred to [facility].  Upon evaluation there he was hemodynamically stable with a blood pressure of 104/69 and heart rate of 78, hemoglobin of 13.1 and BUN to creatinine ratio 33/0.8.  Furthermore the patient had guaiac done that was described by the ED physician, Dr. Zachery Dauer, to be grossly black stool that was also grossly heme positive.  Upon further questioning, the patient does acknowledge chronic heartburn for which he takes p.r.n. Tums virtually on a daily basis for.  He  uses occasional Excedrin for headache or ache, but this is not frequent at all.  He denies any alcohol use.  Upon evaluation at the [facility]  The patient stated that he had a colonoscopy done by Dr. Madilyn Fireman, years ago and wanted to be transferred to [facility] for further evaluation.  PAST MEDICAL HISTORY: 1. Hypertension. 2. Obesity. 3. Coronary artery disease with a 50% lesion in the LAD and a high-     grade lesion in the obtuse marginal that was treated with a Taxus,     drug-eluting stent, stent in December 2007 and normal left     ventricular function. 4. Atrial fibrillation.  The patient has been advised to go on     Coumadin although he does not wish to and he is on aspirin and     Plavix for both his coronary artery disease as well as his atrial     fibrillation. 5. Tobacco abuse.  MEDICATIONS: 1. Aspirin 325 mg p.o. daily. 2. Plavix 75 mg p.o. daily. 3. Lipitor unknown dose. 4. Atenolol unknown dose. 5. Rapaflo 8 mg p.o. daily. 6. Toviaz 4 mg p.o. daily.  ALLERGIES:  No known drug allergies.  SOCIAL HISTORY:  The  patient is married.  Wife's name is Saed Hudlow and cell phone number is [phone].  He currently smokes a pack per day although he had a short time where he had quit.  He denies any alcohol or drug use.  FAMILY HISTORY:  Noncontributory.  REVIEW OF SYSTEMS:  As per HPI.  All others reviewed and negative.  PHYSICAL EXAMINATION:  VITAL SIGNS:  The patient is currently afebrile. Blood pressure at the outside hospital was 104/69 with a heart rate of 78.  Vitals are currently being taken here but pulse was within normal limits as are respirations. GENERAL:  This is an older gentleman lying in bed in no acute distress. HEENT:  Head is normocephalic and atraumatic.  Pupils equally round and reactive to light.  Extraocular movements are intact.  Sclerae anicteric.  He is wearing glasses.  Mucous membranes are moist.  There is no oral pharyngeal  lesions. NECK:  Supple.  There is no JVD.  There is no carotid bruits. LUNGS:  Clear to auscultation bilaterally. HEART:  Normal S1 and S2 with a normal rate, but irregular rhythm. There are no murmurs, gallops, or rubs. ABDOMEN:  Positive bowel sounds, soft, nontender, nondistended with no rebound or guarding. RECTAL:  Not performed here, but per Dr. Zachery Dauer from [place], again the stool was black and tarry appearing and grossly heme-positive. NEUROLOGIC:  He is awake, alert, and oriented x3.  Cranial nerves are intact.  Strength is intact.  Sensation is intact.  LABORATORY DATA:  Again significant findings, complete blood count that was within normal limits, specifically hemoglobin of 13.1, normal platelets.  Metabolic panel again was within normal limits except for a BUN of 33 with a creatinine of 0.8.  As mentioned above stool guaiac was grossly positive for blood.  PT and PTT were both within normal limits at the outside hospital.  Rhythm strip from outside hospital shows atrial fibrillation.  ASSESSMENT AND PLAN: 1. Melena, this is most likely upper gastrointestinal source.  Given     the melena described that is heme-positive along with the chronic     heartburn for which he uses Tums on a daily basis, and the BUN of     33 with a creatinine of 0.8.  At this point, it is unclear as to     the etiology the patient does not endorse alcohol use or heavy     NSAID use.  He does take aspirin and Plavix which may be     contributing.  At this point, we will go ahead and admit to regular     floor.  He is hemodynamically stable, we will place two large bore     IVs, we will check orthostatics, we will hydrate with normal saline     and continue the Protonix drip which he is on at 8 mg an hour.  He     did receive an 80 mg bolus per report at the [facility].  We will go ahead and discuss with by Dr. Matthias Hughs who is on-     call for Dr. Madilyn Fireman, for possible  endoscopy in the morning.  We will     go ahead and start clear liquids tonight and keep n.p.o. after     midnight for EGD in the morning if GI agrees.  We will go ahead and     continue aspirin as the patient does have a drug eluding stent that     was  placed in 2007.  We will start Plavix after the endoscopy if     okay with GI.  Of note, the patient does have a drug-eluting stent     that was placed years ago and therefore Plavix can be held     temporarily. 2. Coronary artery disease as above.  We will continue the patient's     aspirin and we will start the Plavix after the endoscopy if I GI     agrees, pending potential biopsy that may need to be taken.  We     will need to continue the patient's statin and beta-blocker,     although the dosages are currently unknown, but we will go ahead     and start him once the wife brings in his med list. 3. Atrial fibrillation.  The patient is currently in AFib per rhythm     strip and physical exam.  We will go ahead and obtain an EKG.  Of     note, his rate is controlled and he does take a beta-blocker.     Currently the dose is unknown so once this is figure it out, I will     go ahead and restart at home dose.  The patient has been advised to     go on Coumadin, although he has not accepted this recommendation.     Therefore he is currently on aspirin and Plavix for both his     coronary artery disease and atrial fibrillation. 4. Benign prostatic hypertrophy.  The patient on Rapaflo 8 mg until he     has 4 mg.  Given problem #1 we will currently hold off on these as     his blood pressure was on the softer side at the outside hospital.     Once the patient is confirmed to be hemodynamically stable and     taking orals after his procedure and go ahead and restart these     medications.     Mariea Stable, MD     MA/MEDQ  D:  07/19/2010  T:  07/19/2010  Job:  [id number]  cc:   Dr. Conley Simmonds, M.D. Luis Abed, MD,  [facility]  Electronically Signed by Mariea Stable MD on 07/23/2010 05:55:13 PM

## 2010-07-29 NOTE — Progress Notes (Signed)
  Phone Note Outgoing Call Call back at Home Phone [phone] P PH     Call placed by: Lajean Saver RN,  July 21, 2010 3:17 PM Call placed to: Patient Action Taken: Phone Call Completed Summary of Call: Callback: Patient reports he just got home fro the hospital. He went to [facility] who then sent him to [facility]. He is home today and is doing well.

## 2010-07-29 NOTE — Progress Notes (Signed)
  Phone Note Outgoing Call   Call placed by: Linton Flemings RN,  July 20, 2010 11:26 AM Call placed to: Patient Summary of Call: message left to call facility with question/concerns

## 2010-08-26 NOTE — Consult Note (Signed)
  [name], [name]               ACCOUNT NO.:  [id number]  MEDICAL RECORD NO.:  [id number]           PATIENT TYPE:  I  LOCATION:  5525                         FACILITY:  [facility]  PHYSICIAN:  Bernette Redbird, M.D.   DATE OF BIRTH:  08-01-1950  DATE OF CONSULTATION:  07/19/2010 DATE OF DISCHARGE:                                CONSULTATION   Dr. Onalee Hua of the [facility] asked [place] to see this 60 year old gentleman because of melena.  The patient has no prior history of GI bleeding, but he is on aspirin and Plavix for drug-eluting stent placed 5 years ago.  Two days ago, the patient had multiple melenic stools and since then he has had some slight dizziness with an occasional continued melenic stool.  He has also had some nausea.  No frank syncope.  He presented to the emergency room in [place] and was transferred here because he is known to my partner, Dr. Dorena Cookey, who performed a colonoscopy on him several years ago, at which time apparently several polyps were identified.  PAST MEDICAL HISTORY:  No known allergies.  OUTPATIENT MEDICATIONS:  Lipitor, Plavix, 325 mg aspirin, Avalide, and potassium.  The patient is not on a PPI.  OPERATIONS:  Surgery for fistula may be 10 years ago.  No abdominal surgery.  CHRONIC MEDICAL ILLNESSES:  Coronary artery disease, status post MI. 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  No diabetes or hypertension.  HABITS:  He smokes one pack per day, nondrinker.  FAMILY HISTORY:  Unknown since he is adopted.  SOCIAL HISTORY:  Married, wife at bedside.  REVIEW OF SYSTEMS:  Fairly significant reflux symptoms, using Tums probably 4 days out of 5, with rather random occurrence of the reflux, not necessarily nocturnally, but rather typically morning and night.  No anorexia, weight loss, dysphagia, chronic abdominal pain, constipation, or rectal bleeding.  He does have a tendency for fairly frequent diarrhea, not clearly related to restaurant  food.  PHYSICAL EXAMINATION:  GENERAL:  A stocky, healthy-appearing, pleasant Caucasian male in no acute distress.  Neither anxious nor depressed. Anicteric.  No pallor. SKIN:  Warm. CHEST:  Clear with perhaps a few soft expiratory rhonchi. HEART:  Normal. ABDOMEN:  Somewhat adipose but without guarding, mass effect, or tenderness.  Labs pending at this time.  Apparently, hemoglobin was normal at [facility], but his BUN was slightly elevated.  IMPRESSION: 1. Subacute upper gastrointestinal bleed characterized by melenic     stool and rise in BUN in a patient on aspirin. 2. Past history of colon polyps of indeterminate histology at this     time, records not available.  PLAN:  Endoscopic evaluation in the morning or sooner if the patient destabilizes.  Ashby Dawes, purpose, and risks reviewed, and the patient agreeable.          ______________________________ Bernette Redbird, M.D.     RB/MEDQ  D:  07/19/2010  T:  07/20/2010  Job:  [id number]  Electronically Signed by Bernette Redbird M.D. on 08/26/2010 12:47:21 PM

## 2010-08-29 ENCOUNTER — Telehealth: Payer: Self-pay | Admitting: Internal Medicine

## 2010-08-29 NOTE — Telephone Encounter (Signed)
Pt needs refill atorvastine 80 mg, avalide 150/12.5mg , plavix 75mg , khor-kon 20 meg, and atenilol 100 mg [facility] [place] 30 day supply with refills for 1 year

## 2010-08-30 MED ORDER — IRBESARTAN-HYDROCHLOROTHIAZIDE 150-12.5 MG PO TABS: 1.0000 | ORAL_TABLET | Freq: Every day | ORAL | Status: DC

## 2010-08-30 MED ORDER — POTASSIUM CHLORIDE CRYS ER 20 MEQ PO TBCR: 20.0000 meq | EXTENDED_RELEASE_TABLET | Freq: Every day | ORAL | Status: DC

## 2010-08-30 MED ORDER — ATENOLOL 100 MG PO TABS: 100.0000 mg | ORAL_TABLET | Freq: Every day | ORAL | Status: DC

## 2010-08-30 MED ORDER — ATORVASTATIN CALCIUM 80 MG PO TABS: 80.0000 mg | ORAL_TABLET | Freq: Every day | ORAL | Status: DC

## 2010-08-30 MED ORDER — CLOPIDOGREL BISULFATE 75 MG PO TABS: 75.0000 mg | ORAL_TABLET | Freq: Every day | ORAL | Status: DC

## 2010-08-30 NOTE — Telephone Encounter (Signed)
Pt has not been seen since 11/ 2010 must make an appt for more refills

## 2010-09-11 ENCOUNTER — Other Ambulatory Visit: Payer: Self-pay | Admitting: Gastroenterology

## 2010-09-23 NOTE — Assessment & Plan Note (Signed)
[facility]                            CARDIOLOGY OFFICE NOTE   NAME:[name], [name]                      MRN:          [id number]  DATE:02/02/2008                            DOB:          Mar 07, 1951    [name] is here for Cardiology followup.  He was seen last in April  2008.  He is actually doing well.  He has coronary artery disease and  atrial fib.  Historically, he has refused Coumadin.  He is on aspirin  and Plavix.  He is not having any chest pain or shortness of breath.  He  is doing about reasonable activities.  He has, in fact, cut his smoking  down and he says that he smokes only a rare cigarette.  He has not had  any syncope or presyncope.   PAST MEDICAL HISTORY:   ALLERGIES:  No known drug allergies.   MEDICATIONS:  Atenolol, potassium, Avalide, aspirin, multivitamin,  Lipitor, and Plavix.   OTHER MEDICAL PROBLEMS:  See the list below.   REVIEW OF SYSTEMS:  Today, he feels fine and his review of systems is  negative.   PHYSICAL EXAMINATION:  VITAL SIGNS:  Weight is 241 pounds.  Blood  pressure is 128/80 with a pulse of 56.  GENERAL:  The patient is oriented to person, time, and place.  Affect is  normal.  HEENT:  No xanthelasma.  He has normal extraocular motion.  NECK:  There are no carotid bruits.  There is no jugular venous  distention.  LUNGS:  Clear.  Respiratory effort is not labored.  CARDIAC:  S1 with an S2.  There are no clicks or significant murmurs.  ABDOMEN:  Obese, but soft.  EXTREMITIES:  He has no peripheral edema.   EKG reveals atrial fib with a slow rate.   PROBLEMS:  1. Hypertension treated.  2. Obesity.  He clearly needs to lose weight.  3. Left ventricular function.  His echo done on April 15, 2006,      revealed an ejection fraction of 60%.  4. Atrial fibrillation.  I would like for him to be on Coumadin, but      he has refused.  He remains on aspirin and Plavix.  5. Coronary artery disease.  He  has a 50% left anterior descending      artery lesion.  His obtuse marginal was treated with a Taxus stent      in 2007.  This was a large vessel.  6. Smoking.  Fortunately, he only smokes a rare cigarette.   [name] is stable.  We will check a BMET.  He also needs a followup  fasting lipid profile.     Willie Abed, MD, [facility]  Electronically Signed    JDK/MedQ  DD: 02/02/2008  DT: 02/03/2008  Job #: [id number]   cc:   Willie Mole. Swords, MD

## 2010-09-26 NOTE — Discharge Summary (Signed)
NAMEBLADIMIR, AUMAN               ACCOUNT NO.:  [id number]   MEDICAL RECORD NO.:  [id number]          PATIENT TYPE:  INP   LOCATION:  2917                         FACILITY:  [facility]   PHYSICIAN:  Veverly Fells. Excell Seltzer, MD  DATE OF BIRTH:  Nov 24, 1950   DATE OF ADMISSION:  04/14/2006  DATE OF DISCHARGE:  04/16/2006                               DISCHARGE SUMMARY   PROCEDURES:  1. Cardiac catheterization.  2. Coronary arteriogram.  3. Left ventriculogram.  4. Percutaneous transluminal coronary angioplasty with drug-eluting      stent to obtuse marginal 1.  5. Starclose right femoral artery.   PRIMARY DIAGNOSIS:  Anterolateral non-ST segment elevation myocardial  infarction.   SECONDARY DIAGNOSES:  1. History of atrial fibrillation  2. Hypertension.  3. Ongoing tobacco use.  4. History of irritable bowel syndrome.  5. Hyperlipidemia with a total cholesterol of 174, triglycerides 218,      HDL 32, LDL 98.  6. Hyperglycemia with a blood sugar of 136, no hemoglobin A1c      performed.  7. History of left hand surgery in 1995.  8. Chronic joint pain, possible osteoarthritis.  9. Hypokalemia.   Time at discharge 33 minutes.   HOSPITAL COURSE:  Mr. Kataoka is a 60 year old male with a history of  atrial fibrillation who had several hours of substernal chest pain and  came to the hospital.  He was admitted for further evaluation and  treatment.   His initial CK-MB was 128/4.8 with an initial troponin of 0.06.  He was  taken to the catheterization lab on April 14, 2006 for further  evaluation and had a 95% OM treated with PTCA and a Taxus stent,  reducing the stenosis to 0.  He had a 50% LAD and nonobstructive plaque  in his RCA with an EF of 60%.   Postprocedure, his CK-MB peaked at 3236/524.9.  His troponin I was  greater than 100.  His cardiac enzymes trended down over the next 48  hours, and even on December7, his CK-MB was still elevated 532/54.3.  However, he was having no  more chest pain and no shortness of breath.   Mr. Hutcherson was seen by cardiac rehab as well as smoking cessation.  He is  determined to quit smoking and states that he will follow up with  cardiac rehab as an outpatient as well.  He was educated on MI  restrictions, heart healthy lifestyle and cardiac risk factor reduction.   By April 16, 2006, Mr. Burright was ambulating without chest pain or  shortness of breath.  Potassium was slightly low at 3.4, but this was  supplemented.  He was evaluated by Dr. Excell Seltzer and considered stable for  discharge with outpatient follow-up arranged.   DISCHARGE INSTRUCTIONS:  His activity level is to be increased according  to rehab guidelines.  He is to call our office for any problems with the  cath site.  He is to stick to a low-fat diet.  He is not to use tobacco.  He is to follow up with Dr. Myrtis Ser on [place] at 8:45 and with Dr.  swords as needed.   DISCHARGE MEDICATIONS:  1. Avalide 150/12.5 mg daily.  2. Aspirin 325 mg daily.  3. Plavix 75 mg daily.  4. Nitroglycerin sublingual p.r.n.  5. Lipitor 80 mg daily p.m.  6. Atenolol 100 mg daily.  7. K-Dur 20 mg a day.      Theodore Demark, PA-C      Veverly Fells. Excell Seltzer, MD  Electronically Signed    RB/MEDQ  D:  04/16/2006  T:  04/17/2006  Job:  [id number]

## 2010-09-26 NOTE — Assessment & Plan Note (Signed)
[facility]                            CARDIOLOGY OFFICE NOTE   NAME:[name], [name]                      MRN:          [id number]  DATE:04/21/2006                            DOB:          01-Mar-1951    [name] is here and is stable today.  I had seen him last in April of  2007.  He has hypertension.  He has obesity.  Historically, he has had  normal LV function.  He has lone atrial fibrillation.  We had discussed  this at great length in the past.  He has not wanted to take Coumadin.  He now has documented coronary disease, and, therefore, has an  additional risk factor.  We will need to talk seriously about Coumadin.  He is on aspirin and Plavix at this time.  I am hesitant to add the  Coumadin in the setting of him not wanting Coumadin as of today.  We  will talk with him about it over the next few months.   The patient presented to [facility] with chest pain.  He felt like he  had a fist in the center of his chest that was expanding.  He presented  and he had had several hours of pain.  In the cath lab, he had a 95% OM  that was treated with a Taxus stent.  He has a 50% LAD lesion.  His  ejection fraction was thought to be in the low normal range.  After his  procedure, he had a very high CPK at 3200 with an MB of 520.  Troponin  was greater than 100.  This came down over time.  He was felt to be  stable.  He is doing well.  He has not had return of any of his  significant symptoms.  He is not having any chest pain or shortness of  breath.   ALLERGIES:  NO KNOWN DRUG ALLERGIES.   PAST MEDICATIONS:  1. Atenolol 100.  2. Potassium 20.  3. Avalide 150/12.5.  4. Aspirin 325.  5. Multivitamin.  6. Vitamin C.  7. Lipitor 80.  8. Plavix 75.   OTHER MEDICAL PROBLEMS:  See the list below.   REVIEW OF SYSTEMS:  He is feeling well.  He is not having any  significant symptoms at this time, and his review of systems is  negative.   PHYSICAL  EXAMINATION:  Patient weighs 258 pounds.  This has increased  since April.  He must work on weight loss.  Patient's wife is in the  room, and we have discussed all issues.  Blood pressure is 118/80 with a pulse of 66.  The patient is oriented to person, time, and place.  Affect is normal.  HEENT:  No xanthelasma.  He has normal extraocular motion.  NECK:  Reveals no bruits.  He has no jugular venous distention.  CARDIAC:  Exam reveals an S1 with an S2.  He has no clicks or  significant murmurs.  ABDOMEN:  Obese but soft.  The patient has no significant peripheral edema.  He has no significant musculoskeletal  deformities.  He does have multiple skin tags.   EKG reveals atrial fibrillation with his rate controlled.  He also has  nonspecific STT wave changes.   I discussed the patient's overall cardiac event with the patient and his  wife.  He really has been stable.  He did have a high enzyme bump.  He  has definitely had a heart attack, but appears to be stable.  He is  recovering and he will be able to return to work in early January.   PROBLEMS:  1. Hypertension, treated.  2. Obesity, he must lose weight.  3. Left ventricular function with a wall motion abnormality, but below      normal ejection fraction at this time.  4. Atrial fibrillation.  As described above, we must now begin to lean      towards Coumadin therapy.  I have not started at this time, as it      would be a new drug in the face of Plavix and aspirin, and the      patient has been hesitant over time.  This will be readdressed when      I see him back.  5. Coronary artery disease.  Patient has a residual 50% LAD lesion.      He is status post Taxus stent to a 95% OM that was treated with a      Taxus stent, and afterward he had significant enzyme elevation.      The patient's OM that was treated is a large vessel.   Patient will continue on his medications and increase his activity.  He  will be allowed to  return to work in early January.  I will see him for  followup in 3 months.  We will consider a followup 2D echo at a later  date.  I am not going to change his medicines further at this time.     Luis Abed, MD, [facility]  Electronically Signed    JDK/MedQ  DD: 04/21/2006  DT: 04/21/2006  Job #: [id number]

## 2010-09-26 NOTE — Op Note (Signed)
[name], [name]               ACCOUNT NO.:  [id number]   MEDICAL RECORD NO.:  [id number]          PATIENT TYPE:  OIB   LOCATION:  2899                         FACILITY:  [facility]   PHYSICIAN:  Duke Salvia, M.D.  DATE OF BIRTH:  09/18/1950   DATE OF PROCEDURE:  06/24/2005  DATE OF DISCHARGE:  06/24/2005                                 OPERATIVE REPORT   PREOPERATIVE DIAGNOSIS:  Syncope with previously implanted loop recorder.   POSTOPERATIVE DIAGNOSIS:  Syncope with previously implanted loop recorder.   PROCEDURE:  Explantation of a previously implanted loop recorded.   SURGEON:  Duke Salvia, M.D.   DESCRIPTION OF PROCEDURE:  Following the attainment of informed consent, the  patient was brought to the electrophysiology laboratory and placed on the  fluoroscopy table in the supine position.  After routine prep and drape of  the left upper chest, lidocaine was infiltrated over the line of the  previous incision and the incision carried down to the layer of the device  pocket using sharp dissection and electrocautery.  The pocket was opened and  the previously implanted device was removed with the anchoring sutures  removed.  The pocket was then copiously irrigated with antibiotic-containing  saline solution.  The anterior and posterior aspects of the pocket were  apposed.  The wound was then closed in 3 layers in the normal fashion.  The  wound was washed and dried and a Benzoin/Steri-Strip dressing was then  applied.  Needle counts, sponge counts and instrument counts were correct at  the end of the procedure according to the staff.  The patient tolerated the  procedure without apparent complication.           ______________________________  Duke Salvia, M.D.     SCK/MEDQ  D:  06/24/2005  T:  06/25/2005  Job:  [id number]   cc:   [facility]

## 2010-09-26 NOTE — Op Note (Signed)
[name], [name]                         ACCOUNT NO.:  [id number]   MEDICAL RECORD NO.:  [id number]                   PATIENT TYPE:  OIB   LOCATION:  2899                                 FACILITY:  [facility]   PHYSICIAN:  Duke Salvia, M.D.               DATE OF BIRTH:  1950/07/05   DATE OF PROCEDURE:  06/19/2003  DATE OF DISCHARGE:  06/19/2003                                 OPERATIVE REPORT   PREOPERATIVE DIAGNOSIS:  Recurrent presyncope.   POSTOPERATIVE DIAGNOSIS:  Recurrent presyncope.   PROCEDURE:  Implantation of a loupe recorder.   DESCRIPTION OF PROCEDURE:  Following obtaining informed consent, the patient  was brought to the electrophysiology laboratory and placed on the  fluoroscopic table in the supine position.  After routine prep and drape and  transcutaneous mapping, a site that was identified 3 cm inferior to the  clavicle and 3 cm lateral to the sternum was identified.  Lidocaine was  infiltrated in this position and incision was made and carried down to the  layer of the prepectoral fascia using electrocautery and sharp dissection.  A pocket was formed similarly.  Hemostasis was obtained.   Two 2-0 silk sutures were placed in the cephalad portion of the pocket.  They were then attached to a Medtronic Reveal Plus implantable loupe  recorder model 9526, serial number [id number].  The device was secured to  the prepectoral fascia.  The pocket having been copiously irrigated with  antibiotic containing saline solution and hemostasis having been assured.  The wound was then closed in three layers in the normal fashion.  The wound  was washed, dried, and a Benzoin and Steri-Strip dressing was applied.  Needle, sponge, and instrument counts correct at the end of the procedure  according to the staff.   The patient tolerated the procedure without apparent complications.                                               Duke Salvia, M.D.    SCK/MEDQ  D:   06/19/2003  T:  06/19/2003  Job:  [id number]   cc:   [facility]

## 2010-09-26 NOTE — Assessment & Plan Note (Signed)
[facility]                            CARDIOLOGY OFFICE NOTE   NAME:French, Willie BRANDOW                      MRN:          [id number]  DATE:08/30/2006                            DOB:          1951/02/24    [name] is doing well.  He has returned to work.  He has coronary  disease and atrial fib.  He is very hesitant about going back on  Coumadin.  He is on aspirin and Plavix for his Taxis stent.  He is not  having any chest pain.  He has no significant shortness of breath.  I  have spoken with him at great length about many things over time.  He  does want to direct his own care.  He had a high-grade OM lesion that  was treated with a Taxis stent on April 14, 2006.  He has a residual  50% lesion.  He had a very high CPK elevation.  We have not yet re-  assessed his LV function.  He is on appropriate medicines.   PAST MEDICAL HISTORY:  ALLERGIES:  NO KNOWN DRUG ALLERGIES.  Medications:  1. Atenolol 100.  We will need to change this to Carvedilol.  2. Potassium.  3. Avalide.  4. Aspirin.  5. Multivitamin.  6. Vitamin C.  7. Lipitor 80.  8. Plavix 75.   OTHER MEDICAL PROBLEMS:  See the list below.   REVIEW OF SYSTEMS:  Overall, he is feeling well and has no significant  complaint, and his review of systems is negative.   PHYSICAL EXAMINATION:  VITAL SIGNS:  Blood pressure is 113/70 with a  pulse of 76.  Patient is here with his wife today.  His weight is 247  pounds, and he knows he needs to lose weight.  GENERAL:  Patient is oriented to person, time and place.  Affect is  normal.  HEENT:  Reveals no xanthelasma.  He has normal extraocular motion.  There are no carotid bruits.  There is no jugular venous distention.  LUNGS:  Clear.  Respiratory effort is not labored.  CARDIAC:  Reveals an S1 with an S2.  There are no clicks or significant  murmurs.  ABDOMEN:  Obese but soft.  He has no peripheral edema.   EKG reveals his old atrial  fibrillation with a controlled rate.   PROBLEMS:  Include:  1. Hypertension, treated.  2. Obesity, and he must lose weight.  3. LV function.  He had a low normal ejection fraction at the time of      his event in the hospital.  We will plan a follow-up echo, after      his next visit.  4. Atrial fibrillation.  I would want him very much to be on Coumadin.      He is very hesitant.  At this time, he is very concerned about      bleeding risk.  I feel that it is not appropriate to push Coumadin      while he is on aspirin and Plavix.  He needs this for his Taxis  stent.  When I see him back in the future, we will re-discuss these      issues.  5. Coronary artery disease.  He has 50% residual LAD lesion.  His high-      grade OM lesion was treated with a Taxis stent.  This was a large      vessel.  6. Left ventricular function.  Will plan to repeat this with a      followup echo in the future.  7. Smoking.  I had a long discussion with the patient.  He has not      stopped smoking yet.  I have recommended Chantix and written a      prescription for him, and he will consider.     Willie Abed, MD, [facility]  Electronically Signed    JDK/MedQ  DD: 08/30/2006  DT: 08/30/2006  Job #: [id number]   cc:   Valetta Mole. Swords, MD

## 2010-09-26 NOTE — H&P (Signed)
NAMEELIOR, Willie NO.:  [id number]   MEDICAL RECORD NO.:  [id number]          PATIENT TYPE:  EMS   LOCATION:  MAJO                         FACILITY:  [facility]   PHYSICIAN:  Brayton El, MD    DATE OF BIRTH:  11-May-1951   DATE OF ADMISSION:  04/14/2006  DATE OF DISCHARGE:                              HISTORY & PHYSICAL   CHIEF COMPLAINT:  Chest pain.   HISTORY OF PRESENT ILLNESS:  Mr. Hamblin is a 60 year old white male with  a past medical history significant for atrial fibrillation,  hypertension, and tobacco use presenting with a several-hour history of  a substernal chest pain.  The patient stated that he awoke in the middle  of the night with a feeling in the center of his chest feeling as if he  were punched.  Over the next couple of hours, the pain was mostly there  but occasionally would resolve completely.  He denies any associated  symptoms including shortness of breath, nausea, vomiting, or  diaphoresis.  The patient also states that the pain does not radiate but  rates the pain at a 7 out of 10.  The patient has no prior history of  chest pain, and he also states that he remains fairly active at work.   PAST MEDICAL HISTORY:  1. Atrial fibrillation (the patient currently not on Coumadin      secondary to not being able to tolerate this medication).  2. Hypertension.  3. Irritable bowel.   SOCIAL HISTORY:  The patient smokes approximately two packs of  cigarettes per day and has done so for many years.  He denies any  alcohol use.   FAMILY HISTORY:  The patient is adopted; however, his children do not  have coronary disease.   ALLERGIES:  No known drug allergies.   MEDICATIONS:  1. Aspirin daily.  2. Atenolol 100 mg daily.  3. Potassium chloride 20 mEq daily.  4. Avalide 150/12.5 mg p.o. daily.   REVIEW OF SYSTEMS:  As in HPI.  Other systems reviewed and are negative.   PHYSICAL EXAMINATION:  VITAL SIGNS:  Temperature 98, pulse 77,  respirations 16, blood pressure 134/79, saturating 96% on room air.  GENERAL:  A well-developed, well-nourished white male in no acute  distress.  HEENT:  Normocephalic, atraumatic.  Pupils equal, round, and reactive to  light. Oropharynx clear without erythema.  NECK:  Supple without JVD or bruits.  HEART:  Irregularly, irregular with no murmur or rub.  LUNGS:  Clear bilaterally.  ABDOMEN:  Soft and nontender.  EXTREMITIES:  Without edema.  SKIN:  No rashes or lesions.  PSYCHIATRIC:  The patient is appropriate and has normal levels of  insight.  MUSCULOSKELETAL:  The patient has no reproduction of his pain upon  palpation of the chest.  NEUROLOGIC:  Nonfocal.  CHEST:  No acute cardiopulmonary disease.   EKG is atrial fibrillation with a rate of 77.  That is independently  reviewed by myself.   LABORATORY DATA:  Sodium 139, potassium 4.0, chloride 110, CO2 27, BUN  24, creatinine 0.8, glucose 174, hemoglobin  17, hematocrit 51.  CK-MB is  3.7, troponin-I is less than 0.05, the myoglobin is 74.9, the INR is  0.9.   ASSESSMENT:  Differential for this patient includes:  Acute coronary  syndrome, musculoskeletal origin, or GI origin.  The pain is not  necessarily classical anginal pain, but the patient does have risk  factors of hypertension and very heavy tobacco use.   PLAN:  1. Will admit the patient to Telemetry and rule out myocardial      infarction.  2. Will place the patient on aspirin, heparin protocol, Lopressor 50      mg b.i.d., nitroglycerin drip, morphine p.r.n., and continue his      home dose of Avalide.  Will check fasting lipid panel in the      morning as well as an echocardiogram in the morning.  If the      patient rules out for myocardial infarction, would favor a stress      test to assess for ischemic disease.  3. If the patient rules in, we will start a IIB/IIIA inhibitor tonight      and would recommend a left-heart heart catheterization tomorrow.       We will place the patient n.p.o.      Brayton El, MD  Electronically Signed     SGA/MEDQ  D:  04/14/2006  T:  04/14/2006  Job:  [id number]

## 2010-09-26 NOTE — Cardiovascular Report (Signed)
NAMEARAM, DOMZALSKI               ACCOUNT NO.:  [id number]   MEDICAL RECORD NO.:  [id number]          PATIENT TYPE:  INP   LOCATION:  2917                         FACILITY:  [facility]   PHYSICIAN:  Veverly Fells. Excell Seltzer, MD  DATE OF BIRTH:  [birth year]   DATE OF PROCEDURE:  04/14/2006  DATE OF DISCHARGE:                            CARDIAC CATHETERIZATION   PROCEDURE:  Left heart catheterization, selective coronary angiography,  left ventricular angiography, percutaneous transluminal coronary  angiography and drug eluting stent placement to the first obtuse  marginal branch of the left circumflex and Starclose of the right  femoral artery.   Mr. Goodchild is a 60 year old male presenting with a history of atrial fib,  hypertension, and tobacco abuse, who presents with several hours of  substernal chest pain.  He has had marginal relief with nitroglycerin.  In the setting of his multiple risk factors and ongoing pain, he was  referred for cardiac catheterization and he was treated for unstable  angina.   PROCEDURAL DETAILS:  The risks and indications of the procedure were  reviewed in detail with the patient.  Informed consent was obtained.  The right groin was prepped and draped and anesthetized with 1%  lidocaine.  Utilizing modified Seldinger technique, a 6-French arterial  sheath was placed in the right femoral artery.  Multiple views of the  left and right coronary arteries were taken using a JL-4 and JR-4  catheter respectively.  An angled pigtail catheter was inserted into the  left ventricle and pressures were recorded.  A 30 degree RAO left  ventriculogram was done.  Pull back across the aortic valve was  performed.   Following the diagnostic portion of the test, primary PCI of the first  obtuse marginal branch of the left circumflex was performed.  This was a  large vessel with a 95% proximal stenosis.  The patient was given 600 mg  of clopidogrel on the table prior to the  intervention.  Angiomax was  started.  An XB3.5 mm coronary guiding catheter was inserted.  Once  therapeutic ACT was achieved, a BMW coronary guidewire was inserted into  the first OM.  A 2.5 x 15 mm Maverick balloon was placed across the  lesion and was inflated up to 10 atmospheres.  Following balloon  dilatation, the vessel appeared to be a 3-3.5 mm vessel.  I elected to  place a 3 x 20 mm Taxus stent.  This was deployed at 14 atmospheres.  Following stent deployment, the stent was well expanded and there was  TIMI III flow.  It appeared somewhat undersized for the distal vessel  and I elected to post dilate it with a 3.5 x 15 mm Quantum Maverick  balloon up to 16 atmospheres on two separate inflations.  Following post  dilatation, there was an excellent angiographic result with good stent  expansion and TIMI III blood flow throughout the vessel.   At the conclusion of the intervention, a 6-French Starclose device was  used to seal the arteriotomy.   FINDINGS:  Aortic pressure 124/81 with a mean of 101, left ventricular  pressure  126/18 with an end diastolic pressure of 28.   CORONARY ANGIOGRAPHY:  The left main stem is very large.  It is  angiographically normal.  It trifurcates into a medium caliber LAD, a  tiny ramus intermedius, and a large circumflex.   The LAD courses down to the left ventricular apex.  It gives off a large  first diagonal and small second diagonal branch.  There is a 50%  stenosis in the mid portion of the LAD.  This was very difficult to lay  out and I took multiple views to better define this area.  It does not  appear to represent high grade disease and I think at worst, it is in  the range of 50%.  There is no significant disease in the distal LAD or  in the diagonal branches.   The left circumflex is very large.  This is a left dominant system.  It  gives off a left posterolateral branch as well as a left PDA.  Both of  these branches are  angiographically normal.  There is a very large first  obtuse marginal branch that has a severe stenosis in its proximal  portion of approximately 95%.   The right coronary artery is nondominant.  It is medium caliber.  It  gives off a single RV marginal branch.  There is nonobstructive plaque  in the mid portion of the vessel.   Left ventriculography demonstrates mild segmental LV dysfunction with an  EF of 50%.  There is a lateral/apical wall motion abnormality.   ASSESSMENT:  1. Severe single vessel coronary artery disease with high grade OM1      stenosis.  2. Mild to moderate LAD disease.  3. Nonobstructive plaque in the nondominant right coronary artery.  4. Mild segmental LV dysfunction.   PLAN:  See description above.  PCI of the first obtuse marginal was  performed with a Taxus drug eluting stent with an excellent angiographic  result.  The patient should stay on aspirin indefinitely.  He should be  on clopidogrel for a minimum of one year.  He will continue with  aggressive risk factor modification for his coronary artery disease.      Veverly Fells. Excell Seltzer, MD  Electronically Signed     MDC/MEDQ  D:  04/14/2006  T:  04/15/2006  Job:  [id number]   cc:   Valetta Mole. Swords, MD  Luis Abed, MD, [facility]

## 2010-10-08 ENCOUNTER — Encounter: Payer: Self-pay | Admitting: Cardiology

## 2010-10-16 ENCOUNTER — Telehealth: Payer: Self-pay | Admitting: Cardiology

## 2010-10-16 MED ORDER — CLOPIDOGREL BISULFATE 75 MG PO TABS: 75.0000 mg | ORAL_TABLET | Freq: Every day | ORAL | Status: DC

## 2010-10-16 MED ORDER — POTASSIUM CHLORIDE CRYS ER 20 MEQ PO TBCR: 20.0000 meq | EXTENDED_RELEASE_TABLET | Freq: Every day | ORAL | Status: DC

## 2010-10-16 MED ORDER — IRBESARTAN-HYDROCHLOROTHIAZIDE 150-12.5 MG PO TABS: 1.0000 | ORAL_TABLET | Freq: Every day | ORAL | Status: DC

## 2010-10-16 MED ORDER — ATORVASTATIN CALCIUM 80 MG PO TABS: 80.0000 mg | ORAL_TABLET | Freq: Every day | ORAL | Status: DC

## 2010-10-16 MED ORDER — ATENOLOL 100 MG PO TABS: 100.0000 mg | ORAL_TABLET | Freq: Every day | ORAL | Status: DC

## 2010-10-16 NOTE — Telephone Encounter (Signed)
RX sent into pharmacy. LMOM for pt that they need to keep appt to see Dr. Myrtis Ser on August 3.

## 2010-10-16 NOTE — Telephone Encounter (Signed)
Pt needs refill on all of his meds. Pt has a appt with dr Myrtis Ser in august. [facility]/# [phone]

## 2010-10-27 ENCOUNTER — Encounter: Payer: Self-pay | Admitting: Cardiology

## 2010-12-08 ENCOUNTER — Encounter: Payer: Self-pay | Admitting: Cardiology

## 2010-12-11 ENCOUNTER — Encounter: Payer: Self-pay | Admitting: Cardiology

## 2010-12-11 DIAGNOSIS — I4891 Unspecified atrial fibrillation: Secondary | ICD-10-CM | POA: Insufficient documentation

## 2010-12-11 DIAGNOSIS — E785 Hyperlipidemia, unspecified: Secondary | ICD-10-CM | POA: Insufficient documentation

## 2010-12-11 DIAGNOSIS — I2510 Atherosclerotic heart disease of native coronary artery without angina pectoris: Secondary | ICD-10-CM | POA: Insufficient documentation

## 2010-12-11 DIAGNOSIS — I10 Essential (primary) hypertension: Secondary | ICD-10-CM | POA: Insufficient documentation

## 2010-12-12 ENCOUNTER — Ambulatory Visit (INDEPENDENT_AMBULATORY_CARE_PROVIDER_SITE_OTHER): Payer: PRIVATE HEALTH INSURANCE | Admitting: Cardiology

## 2010-12-12 ENCOUNTER — Encounter: Payer: Self-pay | Admitting: Cardiology

## 2010-12-12 VITALS — BP 167/98 | HR 75 | Ht 68.0 in | Wt 264.0 lb

## 2010-12-12 DIAGNOSIS — I2510 Atherosclerotic heart disease of native coronary artery without angina pectoris: Secondary | ICD-10-CM

## 2010-12-12 DIAGNOSIS — I4891 Unspecified atrial fibrillation: Secondary | ICD-10-CM

## 2010-12-12 DIAGNOSIS — K922 Gastrointestinal hemorrhage, unspecified: Secondary | ICD-10-CM

## 2010-12-12 NOTE — Assessment & Plan Note (Signed)
Atrial fibrillation is controlled.  No change in therapy.  I would like for him to be on either aspirin or Plavix if possible.  The choice should be up in his GI doctor.

## 2010-12-12 NOTE — Patient Instructions (Signed)
Your physician recommends that you schedule a follow-up appointment in: 1 year  

## 2010-12-12 NOTE — Progress Notes (Signed)
HPI Patient is seen for followup of atrial fibrillation.  He's not having significant palpitations.  He also has coronary disease is not having any chest pain or shortness of breath.  Unfortunately his mother is ill in the hospital and he is worried about this today.  He is going about full activities.  Patient mentions that he was hospitalized with GI bleeding recently.  He is being followed carefully by Dr.Buccini.  The patient is on Plavix and he has refused Coumadin in the past.  Is atrial fibrillation risk or does not press for Coumadin at this time. No Known Allergies  Current Outpatient Prescriptions  Medication Sig Dispense Refill  . albuterol (VENTOLIN HFA) 108 (90 BASE) MCG/ACT inhaler Inhale 2 puffs into the lungs every 6 (six) hours as needed.        Marland Kitchen atenolol (TENORMIN) 100 MG tablet Take 1 tablet (100 mg total) by mouth daily.  30 tablet  1  . atorvastatin (LIPITOR) 80 MG tablet Take 1 tablet (80 mg total) by mouth daily.  30 tablet  1  . clopidogrel (PLAVIX) 75 MG tablet Take 1 tablet (75 mg total) by mouth daily.  30 tablet  1  . fesoterodine (TOVIAZ) 4 MG TB24 Take by mouth.        . irbesartan-hydrochlorothiazide (AVALIDE) 150-12.5 MG per tablet Take 1 tablet by mouth daily.  30 tablet  1  . potassium chloride SA (K-DUR,KLOR-CON) 20 MEQ tablet Take 1 tablet (20 mEq total) by mouth daily.  30 tablet  1  . traMADol-acetaminophen (ULTRACET) 37.5-325 MG per tablet Take 1 tablet by mouth every 6 (six) hours as needed.          History   Social History  . Marital Status: Married    Spouse Name: N/A    Number of Children: N/A  . Years of Education: N/A   Occupational History  . Not on file.   Social History Main Topics  . Smoking status: Current Some Day Smoker -- 1.0 packs/day for 40 years    Types: Cigarettes  . Smokeless tobacco: Not on file  . Alcohol Use: No  . Drug Use: No  . Sexually Active: Not on file   Other Topics Concern  . Not on file   Social History  Narrative  . No narrative on file    No family history on file.  Past Medical History  Diagnosis Date  . Atrial fibrillation     Slow rate, patient refused Coumadin  . CAD (coronary artery disease) 2007    DES to OM, 2007  . Mitral regurgitation     mild; echo 04/2006  . Dyslipidemia     Low HDL  . HTN (hypertension)   . Obesity   . Low back pain   . History of colonoscopy   . Tobacco abuse   . Ejection fraction     EF 60%, December, 2007    Past Surgical History  Procedure Date  . Reattached left index finger   . Colonoscopy   . Heart stint 2007    ROS  Patient denies fever, chills, headache, sweats, rash, change in vision, change in hearing, chest pain, cough, nausea vomiting, urinary symptoms.  All other systems are reviewed and are negative.  PHYSICAL EXAM Patient is overweight.  He is oriented to person time and place affect is normal.  Head is atraumatic.  There is no xanthelasma.  Lungs are clear.  Respiratory effort is nonlabored.  Cardiac exam reveals S1-S2.  No clicks or significant murmurs.  The rhythm is irregularly irregular.  The abdomen is soft.  There is no significant peripheral edema. Filed Vitals:   12/12/10 1420  BP: 167/98  Pulse: 75  Height: 5\' 8"  (1.727 m)  Weight: 264 lb (119.75 kg)    EKG Is done today and reviewed by me.  There is atrial fibrillation that is old.  Rate is controlled.  ASSESSMENT & PLAN

## 2010-12-12 NOTE — Assessment & Plan Note (Signed)
I will send information to his gastroenterologist.

## 2010-12-12 NOTE — Assessment & Plan Note (Signed)
Coronary disease is stable.  He does not need further workup at this time. 

## 2011-01-12 ENCOUNTER — Encounter: Payer: Self-pay | Admitting: Family Medicine

## 2011-01-12 ENCOUNTER — Inpatient Hospital Stay (INDEPENDENT_AMBULATORY_CARE_PROVIDER_SITE_OTHER)
Admission: RE | Admit: 2011-01-12 | Discharge: 2011-01-12 | Disposition: A | Discharge status: Home / Self Care | Payer: PRIVATE HEALTH INSURANCE | Source: Ambulatory Visit | Attending: Family Medicine | Admitting: Family Medicine

## 2011-01-12 DIAGNOSIS — M8530 Osteitis condensans, unspecified site: Secondary | ICD-10-CM

## 2011-01-17 ENCOUNTER — Telehealth (INDEPENDENT_AMBULATORY_CARE_PROVIDER_SITE_OTHER): Payer: Self-pay

## 2011-02-10 ENCOUNTER — Other Ambulatory Visit: Payer: Self-pay | Admitting: Cardiology

## 2011-02-11 ENCOUNTER — Other Ambulatory Visit: Payer: Self-pay

## 2011-02-11 MED ORDER — POTASSIUM CHLORIDE CRYS ER 20 MEQ PO TBCR: 20.0000 meq | EXTENDED_RELEASE_TABLET | Freq: Every day | ORAL | Status: DC

## 2011-02-11 MED ORDER — ATORVASTATIN CALCIUM 80 MG PO TABS: 80.0000 mg | ORAL_TABLET | Freq: Every day | ORAL | Status: DC

## 2011-02-11 MED ORDER — ATENOLOL 100 MG PO TABS: 100.0000 mg | ORAL_TABLET | Freq: Every day | ORAL | Status: DC

## 2011-02-18 ENCOUNTER — Encounter: Payer: Self-pay | Admitting: Family Medicine

## 2011-02-18 ENCOUNTER — Inpatient Hospital Stay (INDEPENDENT_AMBULATORY_CARE_PROVIDER_SITE_OTHER)
Admission: RE | Admit: 2011-02-18 | Discharge: 2011-02-18 | Disposition: A | Discharge status: Home / Self Care | Payer: PRIVATE HEALTH INSURANCE | Source: Ambulatory Visit | Attending: Family Medicine | Admitting: Family Medicine

## 2011-02-18 DIAGNOSIS — M8530 Osteitis condensans, unspecified site: Secondary | ICD-10-CM

## 2011-02-26 ENCOUNTER — Other Ambulatory Visit: Payer: Self-pay | Admitting: Cardiology

## 2011-03-19 ENCOUNTER — Other Ambulatory Visit: Payer: Self-pay | Admitting: Cardiology

## 2011-04-01 ENCOUNTER — Emergency Department
Admission: EM | Admit: 2011-04-01 | Discharge: 2011-04-01 | Disposition: A | Discharge status: Home / Self Care | Payer: PRIVATE HEALTH INSURANCE | Source: Home / Self Care | Attending: Family Medicine | Admitting: Family Medicine

## 2011-04-01 ENCOUNTER — Encounter: Payer: Self-pay | Admitting: *Deleted

## 2011-04-01 DIAGNOSIS — M869 Osteomyelitis, unspecified: Secondary | ICD-10-CM

## 2011-04-01 DIAGNOSIS — R1031 Right lower quadrant pain: Secondary | ICD-10-CM

## 2011-04-01 DIAGNOSIS — R1032 Left lower quadrant pain: Secondary | ICD-10-CM

## 2011-04-01 MED ORDER — DEXAMETHASONE 1.5 MG PO KIT: 1.0000 | PACK | ORAL | Status: DC

## 2011-04-01 MED ORDER — METHYLPREDNISOLONE ACETATE PF 80 MG/ML IJ SUSP
80.0000 mg | Freq: Once | INTRAMUSCULAR | Status: AC
  Administered 2011-04-01: 80 mg via INTRAMUSCULAR

## 2011-04-01 MED ORDER — HYDROCODONE-ACETAMINOPHEN 5-500 MG PO TABS: 1.0000 | ORAL_TABLET | Freq: Every evening | ORAL | Status: AC | PRN

## 2011-04-01 NOTE — ED Provider Notes (Signed)
History     CSN: [id number] Arrival date & time: 04/01/2011  9:16 AM   First MD Initiated Contact with Patient 04/01/11 [phone]      Chief Complaint  Patient presents with  . Groin Pain      HPI Comments: Patient was treated for pubic osteitis on 02/18/11 with a Dexpak with almost immediate resolution of pain.  He states that he cut grass two days ago and has developed recurrent bilateral groin pain.  Symptoms are similar to previous  Patient is a 60 y.o. male presenting with groin pain.  Groin Pain This is a recurrent problem. The current episode started yesterday. The problem occurs constantly. The problem has been gradually worsening. Pertinent negatives include no chest pain, no abdominal pain, no headaches and no shortness of breath. The symptoms are aggravated by bending, twisting and walking. The symptoms are relieved by nothing. He has tried nothing for the symptoms.    Past Medical History  Diagnosis Date  . Atrial fibrillation     Slow rate, patient refused Coumadin  . CAD (coronary artery disease) 2007    DES to OM, 2007  . Mitral regurgitation     mild; echo 04/2006  . Dyslipidemia     Low HDL  . HTN (hypertension)   . Obesity   . Low back pain   . History of colonoscopy   . Tobacco abuse   . Ejection fraction     EF 60%, December, 2007  . GI bleeding     2012    Past Surgical History  Procedure Date  . Reattached left index finger   . Colonoscopy   . Heart stint 2007    Family History  Problem Relation Age of Onset  . Adopted: Yes    History  Substance Use Topics  . Smoking status: Current Some Day Smoker -- 1.0 packs/day for 40 years    Types: Cigarettes  . Smokeless tobacco: Not on file  . Alcohol Use: No      Review of Systems  Constitutional: Positive for activity change. Negative for fever, chills, appetite change and fatigue.  HENT: Negative.   Eyes: Negative.   Respiratory: Negative.  Negative for shortness of breath.     Cardiovascular: Negative.  Negative for chest pain.  Gastrointestinal: Negative.  Negative for abdominal pain.  Genitourinary: Negative.        No swelling in scrotum  Musculoskeletal:       Bilateral groin pain with any movement of legs.    Neurological: Negative for headaches.    Allergies  Review of patient's allergies indicates no known allergies.  Home Medications   Current Outpatient Rx  Name Route Sig Dispense Refill  . ALBUTEROL SULFATE HFA 108 (90 BASE) MCG/ACT IN AERS Inhalation Inhale 2 puffs into the lungs every 6 (six) hours as needed.      . ATENOLOL 100 MG PO TABS Oral Take 1 tablet (100 mg total) by mouth daily. 30 tablet 1    Pt needs appt for further refills otherwise please ...  . ATORVASTATIN CALCIUM 80 MG PO TABS Oral Take 1 tablet (80 mg total) by mouth daily. 30 tablet 1    Pt needs appt for further refills otherwise please ...  . CLOPIDOGREL BISULFATE 75 MG PO TABS  TAKE ONE TABLET BY MOUTH DAILY 30 tablet 6  . DEXAMETHASONE 1.5 MG PO KIT Oral Take 1 kit (1.5 mg total) by mouth as directed. Begin tomorrow 1 kit 0  . FESOTERODINE  FUMARATE 4 MG PO TB24 Oral Take by mouth.      Marland Kitchen HYDROCODONE-ACETAMINOPHEN 5-500 MG PO TABS Oral Take 1 tablet by mouth at bedtime as needed for pain. 6 tablet 0  . IRBESARTAN-HYDROCHLOROTHIAZIDE 150-12.5 MG PO TABS  TAKE ONE TABLET BY MOUTH DAILY 30 tablet 0  . POTASSIUM CHLORIDE CRYS CR 20 MEQ PO TBCR Oral Take 1 tablet (20 mEq total) by mouth daily. 30 tablet 1    Pt needs appt for further refills otherwise please ...  . TRAMADOL-ACETAMINOPHEN 37.5-325 MG PO TABS Oral Take 1 tablet by mouth every 6 (six) hours as needed.        BP 160/88  Pulse 82  Temp(Src) 97.6 F (36.4 C) (Oral)  Resp 16  Ht 5\' 8"  (1.727 m)  Wt 262 lb (118.842 kg)  BMI 39.84 kg/m2  SpO2 94%  Physical Exam  Constitutional: He is oriented to person, place, and time. He appears well-developed and well-nourished. No distress.       Patient obese (BMI  39.9)  HENT:  Head: Normocephalic and atraumatic.  Eyes: Conjunctivae and EOM are normal. Pupils are equal, round, and reactive to light.  Neck: Neck supple.  Cardiovascular: Normal rate, regular rhythm and normal heart sounds.   Pulmonary/Chest: Effort normal and breath sounds normal. He exhibits no tenderness.  Abdominal: Soft. There is no tenderness.  Genitourinary: Penis normal.       Scrotum normal without swelling or tenderness.  No regional adenopathy.  No hernias palpated  Musculoskeletal: He exhibits no edema.       Patient has distinct tenderness over symphysis pubis bilaterally.  Palpation there during resisted lateral adduction, and flexion of hips, recreates his groin pain  Lymphadenopathy:    He has no cervical adenopathy.  Neurological: He is alert and oriented to person, place, and time.    ED Course  Procedures       1. Osteitis       MDM  Recurrent pubic osteitis. Given DepoMedrol 80mg  IM.  Begin 6 day Dexpak tomorrow. Rx for analgesic bedtime.  Begin applying ice pack several times daily. Avoid lifting until improved. Since this is recurrent problem, recommend follow-up with orthopedist within about two weeks.        Donna Christen, MD 04/01/11 1019

## 2011-04-01 NOTE — ED Notes (Signed)
Patient c/o pain in groin that started yesterday. He has had the same pain in the past. It is very painful to walk.

## 2011-04-13 ENCOUNTER — Other Ambulatory Visit: Payer: Self-pay | Admitting: Cardiology

## 2011-04-13 NOTE — Progress Notes (Signed)
Summary: PAIN IN LEGS (room 4)   Vital Signs:  Patient Profile:   60 Years Old Male CC:      bilateral leg pain x 3 days Height:     68 inches Weight:      251 pounds O2 Sat:      95 % O2 treatment:    Room Air Temp:     97.7 degrees F oral Pulse rate:   64 / minute Resp:     18 per minute BP sitting:   136 / 86  (left arm) Cuff size:   large  Pt. in pain?   yes    Location:   both legs  Vitals Entered By: Lavell Islam RN (January 12, 2011 12:57 PM)                   Updated Prior Medication List: ATENOLOL 100 MG TABS (ATENOLOL) Take 1 tablet by mouth once a day AVALIDE 150-12.5 MG TABS (IRBESARTAN-HYDROCHLOROTHIAZIDE) Take 1 tablet by mouth once a day DICLOFENAC SODIUM 75 MG TBEC (DICLOFENAC SODIUM) Take 1 tablet by mouth once daily PLAVIX 75 MG TABS (CLOPIDOGREL BISULFATE) Take one tablet by mouth daily LIPITOR 80 MG TABS (ATORVASTATIN CALCIUM) Take one tablet by mouth daily. POTASSIUM CHLORIDE CRYS CR 20 MEQ CR-TABS (POTASSIUM CHLORIDE CRYS CR) Take one tablet by mouth daily TOVIAZ 4 MG XR24H-TAB (FESOTERODINE FUMARATE)  RAPAFLO 4 MG CAPS (SILODOSIN)  KLOR-CON M20 20 MEQ CR-TABS (POTASSIUM CHLORIDE CRYS CR)  VENTOLIN HFA 108 (90 BASE) MCG/ACT AERS (ALBUTEROL SULFATE) 1-2 puffs every 6 hours as needed for shortness of breath / wheezing TRIAMCINOLONE ACETONIDE 0.1 % CREA (TRIAMCINOLONE ACETONIDE) Apply thin layer to affected area two times a day to three times a day ULTRACET 37.5-325 MG TABS (TRAMADOL-ACETAMINOPHEN) 1 by mouth q6 hrs as needed for pain PREDNISONE (PAK) 10 MG TABS (PREDNISONE) 6 day pack, use as directed  Current Allergies (reviewed today): No known allergies History of Present Illness Chief Complaint: bilateral leg pain x 3 days History of Present Illness:  Subjective:  Patient complains of pain in bilateral inguinal areas for about 3 days that occurs only when he flexes his hips, worse when walking.  No pain at night.  He recalls no trauma; no  recent change in activities such as increased walking.  The pain does not radiate.  He has had the pain intermittently in the past but it usually resolves spontaneously.  REVIEW OF SYSTEMS Constitutional Symptoms      Denies fever, chills, night sweats, weight loss, weight gain, and fatigue.  Eyes       Denies change in vision, eye pain, eye discharge, glasses, contact lenses, and eye surgery. Ear/Nose/Throat/Mouth       Denies hearing loss/aids, change in hearing, ear pain, ear discharge, dizziness, frequent runny nose, frequent nose bleeds, sinus problems, sore throat, hoarseness, and tooth pain or bleeding.  Respiratory       Denies dry cough, productive cough, wheezing, shortness of breath, asthma, bronchitis, and emphysema/COPD.  Cardiovascular       Denies murmurs, chest pain, and tires easily with exhertion.    Gastrointestinal       Denies stomach pain, nausea/vomiting, diarrhea, constipation, blood in bowel movements, and indigestion. Genitourniary       Denies painful urination, kidney stones, and loss of urinary control. Neurological       Denies paralysis, seizures, and fainting/blackouts. Musculoskeletal       Complains of muscle pain, joint pain, and joint stiffness.  Denies decreased range of motion, redness, swelling, muscle weakness, and gout.      Comments: both legs Skin       Denies bruising, unusual mles/lumps or sores, and hair/skin or nail changes.  Psych       Denies mood changes, temper/anger issues, anxiety/stress, speech problems, depression, and sleep problems. Other Comments: pain in both legs x 3 days   Past History:  Past Medical History: Reviewed history from 03/25/2009 and no changes required. Atrial fibrillation..patient refused Coumadin...rate is slow Coumadin... patient refused CAD   DES.. OM.. 2007 Tobacco abuse Mitral regurgitation... mild... echo... December, 2007 Dyslipidemia... low HDL Hypertension Obesity Low back pain EF    60%... echo... December, 2007  Past Surgical History: Reviewed history from 03/10/2010 and no changes required. Reattach L Index Finger Colonoscopy Heart stint, 2007  Family History: Reviewed history from 07/04/2010 and no changes required. Adopted   Social History: Reviewed history from 07/04/2010 and no changes required.  Married Current Smoker- 1pack  perday, 40 yrs Alcohol use-no Drug use-no   Objective:  Patient is obese but appears in no distress.  He has discomfort when moving to a supine position Lungs:  Clear to auscultation.  Breath sounds are equal.  Heart:  Regular rate and rhythm without murmurs, rubs, or gallops.  Abdomen:  Nontender without masses or hepatosplenomegaly.  Bowel sounds are present.  No CVA or flank tenderness.   No swelling inguinal region.  No regional inguinal adenopathy.   Extremities:  No edema.  No lower leg tenderness.  There is distinct tenderness over the symphysis pubis.  Pain is elicited with resisted flexion and adduction of the hips while palpating the symphysis pubis.  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 Patient Level III [phone] Services provided After hours-Weekends-Holidays [[id number]] Planning Comments:   Begin 6 day Dexpak.  Begin applying ice pack several times daily ([facility] information and instruction patient handout given)  Tylenol for pain. Followup with [facility] if not improved in 10 to 14 days.   The patient and/or caregiver has been counseled thoroughly with regard to medications prescribed including dosage, schedule, interactions, rationale for use, and possible side effects and they verbalize understanding.  Diagnoses and expected course of recovery discussed and will return if not improved as expected or if the condition worsens.  Patient and/or caregiver verbalized understanding.  Prescriptions: DEXPAK 6 DAY 1.5 MG TABS (DEXAMETHASONE) Take as directed  #1 x 0   Entered and Authorized by:   Donna Christen MD   Signed by:   Donna Christen MD on 01/12/2011   Method used:   Print then Give to Patient   RxID:   [id number]   Orders Added: 1)  Est. Patient Level III [46962] 2)  Services provided After hours-Weekends-Holidays [[id number]]

## 2011-04-13 NOTE — Progress Notes (Signed)
Summary: Pain in Groin (rm 5)   Vital Signs:  Patient Profile:   60 Years Old Male CC:      Bilateral groin pain x yesterday Height:     68 inches Weight:      267 pounds O2 Sat:      95 % O2 treatment:    Room Air Temp:     98.4 degrees F oral Pulse rate:   76 / minute Resp:     16 per minute BP sitting:   155 / 99  (right arm) Cuff size:   large  Pt. in pain?   yes    Location:   right and left groin  Vitals Entered By: Lajean Saver RN (February 18, 2011 11:05 AM)                   Updated Prior Medication List: ATENOLOL 100 MG TABS (ATENOLOL) Take 1 tablet by mouth once a day AVALIDE 150-12.5 MG TABS (IRBESARTAN-HYDROCHLOROTHIAZIDE) Take 1 tablet by mouth once a day DICLOFENAC SODIUM 75 MG TBEC (DICLOFENAC SODIUM) Take 1 tablet by mouth once daily PLAVIX 75 MG TABS (CLOPIDOGREL BISULFATE) Take one tablet by mouth daily LIPITOR 80 MG TABS (ATORVASTATIN CALCIUM) Take one tablet by mouth daily. POTASSIUM CHLORIDE CRYS CR 20 MEQ CR-TABS (POTASSIUM CHLORIDE CRYS CR) Take one tablet by mouth daily TOVIAZ 4 MG XR24H-TAB (FESOTERODINE FUMARATE)  RAPAFLO 4 MG CAPS (SILODOSIN)  KLOR-CON M20 20 MEQ CR-TABS (POTASSIUM CHLORIDE CRYS CR)   Current Allergies: No known allergies History of Present Illness Chief Complaint: Bilateral groin pain x yesterday History of Present Illness: Partient has been DX w/the joint and groin pain before by Dr Cathren Harsh. he states that the steroid dose pack helped the problem right away.e was DX w/osteitis condensans.  REVIEW OF SYSTEMS Constitutional Symptoms      Denies fever, chills, night sweats, weight loss, weight gain, and fatigue.  Eyes       Denies change in vision, eye pain, eye discharge, glasses, contact lenses, and eye surgery. Ear/Nose/Throat/Mouth       Denies hearing loss/aids, change in hearing, ear pain, ear discharge, dizziness, frequent runny nose, frequent nose bleeds, sinus problems, sore throat, hoarseness, and tooth pain or  bleeding.  Respiratory       Denies dry cough, productive cough, wheezing, shortness of breath, asthma, bronchitis, and emphysema/COPD.  Cardiovascular       Denies murmurs, chest pain, and tires easily with exhertion.    Gastrointestinal       Denies stomach pain, nausea/vomiting, diarrhea, constipation, blood in bowel movements, and indigestion. Genitourniary       Denies painful urination, blood or discharge from penis, kidney stones, and loss of urinary control. Neurological       Denies paralysis, seizures, and fainting/blackouts. Musculoskeletal       Complains of muscle pain, joint pain, joint stiffness, and decreased range of motion.      Denies redness, swelling, muscle weakness, and gout.  Skin       Denies bruising, unusual mles/lumps or sores, and hair/skin or nail changes.  Psych       Denies mood changes, temper/anger issues, anxiety/stress, speech problems, depression, and sleep problems. Other Comments: Patient c/o bilateral groin pain x yesterday. He went to his sons wedding over the weekedna dnhad to climb a lot of stairs beacuse the elevator was broken. Pain is present with movement. He c/o of the same pain in the past   Past History:  Family History:  Last updated: 07/04/2010 Adopted   Past Medical History: Reviewed history from 03/25/2009 and no changes required. Atrial fibrillation..patient refused Coumadin...rate is slow Coumadin... patient refused CAD   DES.. OM.. 2007 Tobacco abuse Mitral regurgitation... mild... echo... December, 2007 Dyslipidemia... low HDL Hypertension Obesity Low back pain EF   60%... echo... December, 2007  Past Surgical History: Reviewed history from 03/10/2010 and no changes required. Reattach L Index Finger Colonoscopy Heart stint, 2007  Family History: Reviewed history from 07/04/2010 and no changes required. Adopted   Social History: Reviewed history from 01/12/2011 and no changes required.  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  Plan New Medications/Changes: DEXPAK 6 DAY 1.5 MG TABS (DEXAMETHASONE) Take as directed  #1 x 0, 02/18/2011, Hassan Rowan MD  New Orders: Est. Patient Level III [phone] Follow Up: strongly suggest rheumatologist follow up  The patient and/or caregiver has been counseled thoroughly with regard to medications prescribed including dosage, schedule, interactions, rationale for use, and possible side effects and they verbalize understanding.  Diagnoses and expected course of recovery discussed and will return if not improved as expected or if the condition worsens. Patient and/or caregiver verbalized understanding.  Prescriptions: DEXPAK 6 DAY 1.5 MG TABS (DEXAMETHASONE) Take as directed  #1 x 0   Entered and Authorized by:   Hassan Rowan MD   Signed by:   Hassan Rowan MD on 02/18/2011   Method used:   Print then Give to Patient   RxID:   [id number]   Patient Instructions: 1)  Strongly suggest talking w/PCP or Cardiologist about getting a Rheumatologist follow up. 2)  Use the Dexa pack as directed. 3)  Tobacco is very bad  for your health and your loved ones! You Should stop smoking!. 4)  Stop Smoking Tips: Choose a Quit date. Cut down before the Quit date. decide what you will do as a substitute when you feel the urge to smoke(gum,toothpick,exercise). 5)  Please schedule a follow-up appointment as needed.  Orders Added: 1)  Est. Patient Level III [[id number]]

## 2011-04-13 NOTE — Telephone Encounter (Signed)
  Phone Note Outgoing Call   Call placed by: Linton Flemings RN,  January 17, 2011 2:36 PM Call placed to: Patient Summary of Call: f/u call- no answer- msg left to call facility w/questions/concerns

## 2011-05-26 ENCOUNTER — Other Ambulatory Visit: Payer: Self-pay | Admitting: Cardiology

## 2011-06-12 ENCOUNTER — Encounter: Payer: Self-pay | Admitting: *Deleted

## 2011-06-12 ENCOUNTER — Emergency Department
Admission: EM | Admit: 2011-06-12 | Discharge: 2011-06-12 | Disposition: A | Discharge status: Home / Self Care | Payer: PRIVATE HEALTH INSURANCE | Source: Home / Self Care | Attending: Family Medicine | Admitting: Family Medicine

## 2011-06-12 DIAGNOSIS — J9801 Acute bronchospasm: Secondary | ICD-10-CM

## 2011-06-12 DIAGNOSIS — J069 Acute upper respiratory infection, unspecified: Secondary | ICD-10-CM

## 2011-06-12 MED ORDER — BENZONATATE 200 MG PO CAPS: 200.0000 mg | ORAL_CAPSULE | Freq: Every day | ORAL | Status: AC

## 2011-06-12 MED ORDER — DOXYCYCLINE HYCLATE 100 MG PO TABS: 100.0000 mg | ORAL_TABLET | Freq: Two times a day (BID) | ORAL | Status: AC

## 2011-06-12 MED ORDER — ALBUTEROL SULFATE HFA 108 (90 BASE) MCG/ACT IN AERS: 2.0000 | INHALATION_SPRAY | RESPIRATORY_TRACT | Status: DC | PRN

## 2011-06-12 MED ORDER — PREDNISONE 10 MG PO TABS: ORAL_TABLET | ORAL | Status: DC

## 2011-06-12 NOTE — ED Notes (Signed)
Pt c/o cough, SOB, and runny nose x 3 days. He has taken tylenol prn. Denies fever.

## 2011-06-12 NOTE — ED Provider Notes (Signed)
History     CSN: [id number]  Arrival date & time 06/12/11  1825   First MD Initiated Contact with Patient 06/12/11 1843      Chief Complaint  Patient presents with  . Cough  . Shortness of Breath      HPI Comments: Patient complains of approximately 3 day history of gradually progressive URI symptoms beginning with a mild sore throat (now resolved), followed by progressive nasal congestion.  A cough started next. Complains of minimal fatigue and no myalgias.  Cough is now worse at night and generally non-productive during the day.  There has been no pleuritic pain but he does wheeze and have shortness of breath with activity.  He is no longer using an albuterol inhaler (expired).  The history is provided by the patient.    Past Medical History  Diagnosis Date  . Atrial fibrillation     Slow rate, patient refused Coumadin  . CAD (coronary artery disease) 2007    DES to OM, 2007  . Mitral regurgitation     mild; echo 04/2006  . Dyslipidemia     Low HDL  . HTN (hypertension)   . Obesity   . Low back pain   . History of colonoscopy   . Tobacco abuse   . Ejection fraction     EF 60%, December, 2007  . GI bleeding     2012    Past Surgical History  Procedure Date  . Reattached left index finger   . Colonoscopy   . Heart stint 2007  . Tonsillectomy     Family History  Problem Relation Age of Onset  . Adopted: Yes    History  Substance Use Topics  . Smoking status: Current Some Day Smoker -- 0.2 packs/day for 40 years    Types: Cigarettes  . Smokeless tobacco: Not on file  . Alcohol Use: No      Review of Systems + sore throat, now resolved + cough No pleuritic pain + wheezing + nasal congestion ? post-nasal drainage No sinus pain/pressure No itchy/red eyes No earache No hemoptysis + SOB with activity No fever/chills No nausea No vomiting No abdominal pain No diarrhea No urinary symptoms No skin rashes No fatigue No myalgias No headache     Allergies  Review of patient's allergies indicates no known allergies.  Home Medications   Current Outpatient Rx  Name Route Sig Dispense Refill  . ALBUTEROL SULFATE HFA 108 (90 BASE) MCG/ACT IN AERS Inhalation Inhale 2 puffs into the lungs every 4 (four) hours as needed for wheezing. 1 Inhaler 0  . ATENOLOL 100 MG PO TABS  TAKE ONE TABLET BY MOUTH DAILY 30 tablet 6  . ATORVASTATIN CALCIUM 80 MG PO TABS  TAKE ONE TABLET BY MOUTH DAILY 30 tablet 6  . BENZONATATE 200 MG PO CAPS Oral Take 1 capsule (200 mg total) by mouth at bedtime. Take as needed for cough 12 capsule 0  . CLOPIDOGREL BISULFATE 75 MG PO TABS  TAKE ONE TABLET BY MOUTH DAILY 30 tablet 6  . DEXAMETHASONE 1.5 MG PO KIT Oral Take 1 kit (1.5 mg total) by mouth as directed. Begin tomorrow 1 kit 0  . DOXYCYCLINE HYCLATE 100 MG PO TABS Oral Take 1 tablet (100 mg total) by mouth 2 (two) times daily. 20 tablet 0  . FESOTERODINE FUMARATE ER 4 MG PO TB24 Oral Take by mouth.      . IRBESARTAN-HYDROCHLOROTHIAZIDE 150-12.5 MG PO TABS  TAKE ONE TABLET BY MOUTH DAILY  30 tablet 9  . POTASSIUM CHLORIDE CRYS ER 20 MEQ PO TBCR  TAKE ONE TABLET BY MOUTH DAILY 30 tablet 6  . PREDNISONE 10 MG PO TABS  Take 2 tabs by mouth today, then two tabs twice daily for two days, then one tab twice daily for 2 days, then 1 daily for two days.  Take PC 16 tablet 0  . TRAMADOL-ACETAMINOPHEN 37.5-325 MG PO TABS Oral Take 1 tablet by mouth every 6 (six) hours as needed.        BP 135/85  Pulse 71  Temp(Src) 97.9 F (36.6 C) (Oral)  Resp 20  Ht 5\' 8"  (1.727 m)  Wt 258 lb 8 oz (117.255 kg)  BMI 39.30 kg/m2  SpO2 94%  Physical Exam Nursing notes and Vital Signs reviewed. Appearance:  Patient appears in no acute distress.  He is obese Eyes:  Pupils are equal, round, and reactive to light and accomodation.  Extraocular movement is intact.  Conjunctivae are not inflamed  Ears:  Canals normal.  Tympanic membranes normal.  Nose:  Mildly congested turbinates.  No  sinus tenderness.    Pharynx:  Normal Neck:  Supple.   No adenopathy  Lungs:   Diffuse faint musical wheezes all fields.  No rales.  Breath sounds are equal.  Heart:   Irregular rhythm without murmurs, rubs, or gallops.  Abdomen:  Nontender without masses or hepatosplenomegaly.  Bowel sounds are present.  No CVA or flank tenderness.  Extremities:  No edema.  No calf tenderness Skin:  No rash present.   ED Course  Procedures  none      1. Acute upper respiratory infections of unspecified site   2. Bronchospasm       MDM  With a history of bronchitis, will begin doxycycline for 7 to 10 days.  Begin tapering course of prednisone.  Tessalon at bedtime.  Rx for albuterol MDI. Take plain Mucinex (guaifenesin) twice daily for cough and congestion.  Increase fluid intake, rest. May use Afrin nasal spray (or generic oxymetazoline) twice daily for about 5 days.  Also recommend using saline nasal spray several times daily and saline nasal irrigation (AYR is a common brand) Stop all antihistamines for now, and other non-prescription cough/cold preparations. Followup with PCP if not improving one week.        Donna Christen, MD 06/12/11 [phone]

## 2011-09-15 ENCOUNTER — Encounter: Payer: Self-pay | Admitting: Emergency Medicine

## 2011-09-15 ENCOUNTER — Emergency Department
Admission: EM | Admit: 2011-09-15 | Discharge: 2011-09-15 | Disposition: A | Discharge status: Home Health | Payer: PRIVATE HEALTH INSURANCE | Source: Home / Self Care | Attending: Emergency Medicine | Admitting: Emergency Medicine

## 2011-09-15 DIAGNOSIS — M545 Low back pain: Secondary | ICD-10-CM

## 2011-09-15 MED ORDER — TRAMADOL-ACETAMINOPHEN 37.5-325 MG PO TABS: 1.0000 | ORAL_TABLET | Freq: Three times a day (TID) | ORAL | Status: AC | PRN

## 2011-09-15 MED ORDER — PREDNISONE (PAK) 10 MG PO TABS: 10.0000 mg | ORAL_TABLET | Freq: Every day | ORAL | Status: AC

## 2011-09-15 NOTE — ED Notes (Signed)
Low back pain x 2 days, chronic

## 2011-09-15 NOTE — ED Provider Notes (Signed)
History     CSN: [id number]  Arrival date & time 09/15/11  1006   First MD Initiated Contact with Patient 09/15/11 1014      Chief Complaint  Patient presents with  . Back Pain    (Consider location/radiation/quality/duration/timing/severity/associated sxs/prior treatment) HPI The patient presents today with back pain.  He states that late last week he was bent over and reeling up some wire but otherwise does not recall any recent trauma or injury or heavy lifting pushing or pulling.  He has had similar back pain in the past and after 1-2 days of prednisone it usually goes away completely.  He's been told the past he does have some arthritis. Location: lower back  Timing: constant Description: tight spasm Worse with: certain movements Better with: rest, Tylenol  Trauma: no Bladder/bowel incontinence: no Weakness: no Fever/chills: no Night pain: no Unexplained weight loss: no Cancer/immunosuppression: no PMH of osteoporosis or chronic steroid use:  no    Past Medical History  Diagnosis Date  . Atrial fibrillation     Slow rate, patient refused Coumadin  . CAD (coronary artery disease) 2007    DES to OM, 2007  . Mitral regurgitation     mild; echo 04/2006  . Dyslipidemia     Low HDL  . HTN (hypertension)   . Obesity   . Low back pain   . History of colonoscopy   . Tobacco abuse   . Ejection fraction     EF 60%, December, 2007  . GI bleeding     2012    Past Surgical History  Procedure Date  . Reattached left index finger   . Colonoscopy   . Heart stint 2007  . Tonsillectomy     Family History  Problem Relation Age of Onset  . Adopted: Yes    History  Substance Use Topics  . Smoking status: Current Some Day Smoker -- 0.2 packs/day for 40 years    Types: Cigarettes  . Smokeless tobacco: Not on file  . Alcohol Use: No      Review of Systems  All other systems reviewed and are negative.    Allergies  Review of patient's allergies indicates  not on file.  Home Medications   Current Outpatient Rx  Name Route Sig Dispense Refill  . ALBUTEROL SULFATE HFA 108 (90 BASE) MCG/ACT IN AERS Inhalation Inhale 2 puffs into the lungs every 4 (four) hours as needed for wheezing. 1 Inhaler 0  . ATENOLOL 100 MG PO TABS  TAKE ONE TABLET BY MOUTH DAILY 30 tablet 6  . ATORVASTATIN CALCIUM 80 MG PO TABS  TAKE ONE TABLET BY MOUTH DAILY 30 tablet 6  . CLOPIDOGREL BISULFATE 75 MG PO TABS  TAKE ONE TABLET BY MOUTH DAILY 30 tablet 6  . DEXAMETHASONE 1.5 MG PO KIT Oral Take 1 kit (1.5 mg total) by mouth as directed. Begin tomorrow 1 kit 0  . FESOTERODINE FUMARATE ER 4 MG PO TB24 Oral Take by mouth.      . IRBESARTAN-HYDROCHLOROTHIAZIDE 150-12.5 MG PO TABS  TAKE ONE TABLET BY MOUTH DAILY 30 tablet 9  . POTASSIUM CHLORIDE CRYS ER 20 MEQ PO TBCR  TAKE ONE TABLET BY MOUTH DAILY 30 tablet 6  . PREDNISONE 10 MG PO TABS  Take 2 tabs by mouth today, then two tabs twice daily for two days, then one tab twice daily for 2 days, then 1 daily for two days.  Take PC 16 tablet 0  . PREDNISONE (PAK) 10  MG PO TABS Oral Take 1 tablet (10 mg total) by mouth daily. 6 day pack, use as directed, Disp 1 pack 21 tablet 0  . TRAMADOL-ACETAMINOPHEN 37.5-325 MG PO TABS Oral Take 1 tablet by mouth every 6 (six) hours as needed.      Marland Kitchen TRAMADOL-ACETAMINOPHEN 37.5-325 MG PO TABS Oral Take 1 tablet by mouth every 8 (eight) hours as needed for pain. 22 tablet 0    BP 157/87  Pulse 66  Temp(Src) 97.8 F (36.6 C) (Oral)  Resp 20  Ht 5\' 8"  (1.727 m)  Wt 257 lb (116.574 kg)  BMI 39.08 kg/m2  SpO2 95%  Physical Exam  Nursing note and vitals reviewed. Constitutional: He is oriented to person, place, and time. He appears well-developed and well-nourished. He appears distressed (mildly, walking with cane with difficulty transferring).  HENT:  Head: Normocephalic and atraumatic.  Eyes: No scleral icterus.  Neck: Neck supple.  Cardiovascular: Regular rhythm and normal heart sounds.     Pulmonary/Chest: Effort normal and breath sounds normal. No respiratory distress.  Musculoskeletal:       Lumbar back: He exhibits decreased range of motion (Secondary to pain), tenderness, pain and spasm. He exhibits no bony tenderness.       He has bilateral paralumbar spasm and tenderness.  Straight leg raise negative bilaterally.  He has difficulty transferring from sitting to standing.  Neurological: He is alert and oriented to person, place, and time. He has normal strength and normal reflexes. No sensory deficit.  Skin: Skin is warm and dry. No rash noted.  Psychiatric: He has a normal mood and affect. His speech is normal.    ED Course  Procedures (including critical care time)  Labs Reviewed - No data to display No results found.   1. Pain in lower back       MDM   he apparently has musculoskeletal lower back pain.  I gave him a prescription for prednisone and for Ultracet.  He states that he will likely only take the prednisone since that has helped him most in the past.  He does not need to fill the tramadol unless he is getting worse.  I also would like him to limit lifting, pushing, pulling, or strenuous work.  Heating pad, massage, and generalized rest is encouraged.  If he is not improving in the next few days, then he is to followup with his PCP or a back specialist.      Marlaine Hind, MD 09/15/11 1029

## 2011-09-26 ENCOUNTER — Emergency Department
Admission: EM | Admit: 2011-09-26 | Discharge: 2011-09-26 | Disposition: A | Discharge status: Home / Self Care | Payer: PRIVATE HEALTH INSURANCE | Source: Home / Self Care

## 2011-09-26 DIAGNOSIS — S335XXA Sprain of ligaments of lumbar spine, initial encounter: Secondary | ICD-10-CM

## 2011-09-26 MED ORDER — CYCLOBENZAPRINE HCL 10 MG PO TABS: 10.0000 mg | ORAL_TABLET | Freq: Every evening | ORAL | Status: AC | PRN

## 2011-09-26 MED ORDER — CYCLOBENZAPRINE HCL 10 MG PO TABS: 10.0000 mg | ORAL_TABLET | Freq: Every evening | ORAL | Status: DC | PRN

## 2011-09-26 NOTE — Discharge Instructions (Signed)

## 2011-09-26 NOTE — ED Provider Notes (Signed)
History     CSN: [id number]  Arrival date & time 09/26/11  1123   First MD Initiated Contact with Patient 09/26/11 1132      No chief complaint on file. HPI Comments: Pt with hx/o recurrent low back.  Was last seen here 2 weeks ago for similar pain.  Was rxd prednisone and ultracet.  Pt has finished prednisone.  Is using ultracet intermittently with minimal relief in sxs.  BACK PAIN: Location: L lower lumbar/gluteal region Timing: over last 1-2 weeks, see above  Description: Persistent L loer lumbar pain Worse with: bending, flexion, getting up in am  Better with: rest Trauma: no Trigger: Works outdoors, does recurrent manual labor.  Bladder/bowel incontinence: no Weakness: no Fever/chills: no Night pain:yes; mild Unexplained weight loss: no Cancer/immunosuppression: no PMH of osteoporosis or chronic steroid use:  no     Patient is a 61 y.o. male presenting with back pain.  Back Pain  This is a recurrent problem. The current episode started more than 1 week ago. The problem occurs daily. The problem has not changed since onset.The pain is present in the lumbar spine and gluteal region. The quality of the pain is described as aching. The pain does not radiate. The pain is at a severity of 5/10. The pain is mild. Worse during: worse in am, sometimes at night   Back Pain This is a recurrent problem. The current episode started more than 1 week ago. The problem occurs daily. The problem has been not changed since onset. The pain is present in the lumbar spine and gluteal region. The quality of the pain is described as aching. The pain does not radiate. The pain is at a severity of 5/10. The pain is mild. Worse during: worse in am, sometimes at night     Past Medical History  Diagnosis Date  . Atrial fibrillation     Slow rate, patient refused Coumadin  . CAD (coronary artery disease) 2007    DES to OM, 2007  . Mitral regurgitation     mild; echo 04/2006  . Dyslipidemia     Low HDL  . HTN (hypertension)   . Obesity   . Low back pain   . History of colonoscopy   . Tobacco abuse   . Ejection fraction     EF 60%, December, 2007  . GI bleeding     2012    Past Surgical History  Procedure Date  . Reattached left index finger   . Colonoscopy   . Heart stint 2007  . Tonsillectomy     Family History  Problem Relation Age of Onset  . Adopted: Yes    History  Substance Use Topics  . Smoking status: Current Some Day Smoker -- 0.2 packs/day for 40 years    Types: Cigarettes  . Smokeless tobacco: Not on file  . Alcohol Use: No      Review of Systems  Musculoskeletal: Positive for back pain.  All other systems reviewed and are negative.    Allergies  Review of patient's allergies indicates not on file.  Home Medications   Current Outpatient Rx  Name Route Sig Dispense Refill  . ALBUTEROL SULFATE HFA 108 (90 BASE) MCG/ACT IN AERS Inhalation Inhale 2 puffs into the lungs every 4 (four) hours as needed for wheezing. 1 Inhaler 0  . ATENOLOL 100 MG PO TABS  TAKE ONE TABLET BY MOUTH DAILY 30 tablet 6  . ATORVASTATIN CALCIUM 80 MG PO TABS  TAKE ONE TABLET  BY MOUTH DAILY 30 tablet 6  . CLOPIDOGREL BISULFATE 75 MG PO TABS  TAKE ONE TABLET BY MOUTH DAILY 30 tablet 6  . DEXAMETHASONE 1.5 MG PO KIT Oral Take 1 kit (1.5 mg total) by mouth as directed. Begin tomorrow 1 kit 0  . FESOTERODINE FUMARATE ER 4 MG PO TB24 Oral Take by mouth.      . IRBESARTAN-HYDROCHLOROTHIAZIDE 150-12.5 MG PO TABS  TAKE ONE TABLET BY MOUTH DAILY 30 tablet 9  . POTASSIUM CHLORIDE CRYS ER 20 MEQ PO TBCR  TAKE ONE TABLET BY MOUTH DAILY 30 tablet 6  . PREDNISONE 10 MG PO TABS  Take 2 tabs by mouth today, then two tabs twice daily for two days, then one tab twice daily for 2 days, then 1 daily for two days.  Take PC 16 tablet 0  . PREDNISONE (PAK) 10 MG PO TABS Oral Take 1 tablet (10 mg total) by mouth daily. 6 day pack, use as directed, Disp 1 pack 21 tablet 0  .  TRAMADOL-ACETAMINOPHEN 37.5-325 MG PO TABS Oral Take 1 tablet by mouth every 6 (six) hours as needed.      Marland Kitchen TRAMADOL-ACETAMINOPHEN 37.5-325 MG PO TABS Oral Take 1 tablet by mouth every 8 (eight) hours as needed for pain. 22 tablet 0    There were no vitals taken for this visit.  Physical Exam  Constitutional: He appears well-developed and well-nourished.  HENT:  Head: Normocephalic and atraumatic.  Eyes: Pupils are equal, round, and reactive to light.  Neck: Normal range of motion. Neck supple.  Cardiovascular: Normal rate and regular rhythm.   Pulmonary/Chest: Effort normal and breath sounds normal.  Abdominal:       Morbidly obese abdomen.   Musculoskeletal:       Right shoulder: He exhibits decreased range of motion and tenderness. He exhibits no swelling.       Arms:   ED Course  Procedures (including critical care time)  Labs Reviewed - No data to display No results found.   No diagnosis found.    MDM  Lumbar strain. Toradol. NSAIDs. Discussed exercise and weight loss. Handout given. Follow as needed.      The patient and/or caregiver has been counseled thoroughly with regard to treatment plan and/or medications prescribed including dosage, schedule, interactions, rationale for use, and possible side effects and they verbalize understanding. Diagnoses and expected course of recovery discussed and will return if not improved as expected or if the condition worsens. Patient and/or caregiver verbalized understanding.

## 2011-09-30 ENCOUNTER — Ambulatory Visit: Payer: PRIVATE HEALTH INSURANCE | Attending: Family Medicine | Admitting: Physical Therapy

## 2011-09-30 DIAGNOSIS — M545 Low back pain, unspecified: Secondary | ICD-10-CM | POA: Insufficient documentation

## 2011-09-30 DIAGNOSIS — R262 Difficulty in walking, not elsewhere classified: Secondary | ICD-10-CM | POA: Insufficient documentation

## 2011-09-30 DIAGNOSIS — IMO0001 Reserved for inherently not codable concepts without codable children: Secondary | ICD-10-CM | POA: Insufficient documentation

## 2011-09-30 NOTE — ED Provider Notes (Signed)
Pt seen by Dr. Alvester Morin for lumbar strain.  Agree with above.  Marlaine Hind, MD 09/30/11 [phone]

## 2011-10-06 ENCOUNTER — Ambulatory Visit: Payer: PRIVATE HEALTH INSURANCE | Admitting: Physical Therapy

## 2011-10-09 ENCOUNTER — Encounter: Payer: PRIVATE HEALTH INSURANCE | Admitting: Physical Therapy

## 2011-10-13 ENCOUNTER — Ambulatory Visit: Payer: PRIVATE HEALTH INSURANCE | Attending: Family Medicine | Admitting: Physical Therapy

## 2011-10-13 DIAGNOSIS — IMO0001 Reserved for inherently not codable concepts without codable children: Secondary | ICD-10-CM | POA: Insufficient documentation

## 2011-10-13 DIAGNOSIS — M545 Low back pain, unspecified: Secondary | ICD-10-CM | POA: Insufficient documentation

## 2011-10-13 DIAGNOSIS — R262 Difficulty in walking, not elsewhere classified: Secondary | ICD-10-CM | POA: Insufficient documentation

## 2011-10-14 ENCOUNTER — Other Ambulatory Visit: Payer: Self-pay | Admitting: Cardiology

## 2011-10-14 MED ORDER — IRBESARTAN-HYDROCHLOROTHIAZIDE 150-12.5 MG PO TABS: 1.0000 | ORAL_TABLET | Freq: Every day | ORAL | Status: DC

## 2011-10-16 ENCOUNTER — Ambulatory Visit: Payer: PRIVATE HEALTH INSURANCE | Admitting: Physical Therapy

## 2011-10-19 ENCOUNTER — Ambulatory Visit: Payer: PRIVATE HEALTH INSURANCE | Admitting: Physical Therapy

## 2011-10-22 ENCOUNTER — Encounter: Payer: PRIVATE HEALTH INSURANCE | Admitting: Physical Therapy

## 2011-10-23 ENCOUNTER — Ambulatory Visit: Payer: PRIVATE HEALTH INSURANCE | Admitting: Physical Therapy

## 2011-10-27 ENCOUNTER — Ambulatory Visit: Payer: PRIVATE HEALTH INSURANCE | Admitting: Physical Therapy

## 2011-10-29 ENCOUNTER — Encounter: Payer: PRIVATE HEALTH INSURANCE | Admitting: Physical Therapy

## 2012-02-29 ENCOUNTER — Telehealth: Payer: Self-pay

## 2012-02-29 ENCOUNTER — Encounter (HOSPITAL_BASED_OUTPATIENT_CLINIC_OR_DEPARTMENT_OTHER): Payer: Self-pay | Admitting: Emergency Medicine

## 2012-02-29 ENCOUNTER — Encounter: Payer: Self-pay | Admitting: Emergency Medicine

## 2012-02-29 ENCOUNTER — Emergency Department (HOSPITAL_BASED_OUTPATIENT_CLINIC_OR_DEPARTMENT_OTHER)
Admission: EM | Admit: 2012-02-29 | Discharge: 2012-02-29 | Disposition: A | Discharge status: Home / Self Care | Payer: PRIVATE HEALTH INSURANCE | Attending: Emergency Medicine | Admitting: Emergency Medicine

## 2012-02-29 ENCOUNTER — Emergency Department
Admission: EM | Admit: 2012-02-29 | Discharge: 2012-02-29 | Disposition: A | Discharge status: Home / Self Care | Payer: Self-pay | Source: Home / Self Care

## 2012-02-29 ENCOUNTER — Emergency Department (HOSPITAL_BASED_OUTPATIENT_CLINIC_OR_DEPARTMENT_OTHER): Payer: PRIVATE HEALTH INSURANCE

## 2012-02-29 ENCOUNTER — Emergency Department (INDEPENDENT_AMBULATORY_CARE_PROVIDER_SITE_OTHER): Payer: PRIVATE HEALTH INSURANCE

## 2012-02-29 DIAGNOSIS — F17200 Nicotine dependence, unspecified, uncomplicated: Secondary | ICD-10-CM | POA: Insufficient documentation

## 2012-02-29 DIAGNOSIS — IMO0002 Reserved for concepts with insufficient information to code with codable children: Secondary | ICD-10-CM | POA: Insufficient documentation

## 2012-02-29 DIAGNOSIS — M1710 Unilateral primary osteoarthritis, unspecified knee: Secondary | ICD-10-CM | POA: Insufficient documentation

## 2012-02-29 DIAGNOSIS — Z9889 Other specified postprocedural states: Secondary | ICD-10-CM | POA: Insufficient documentation

## 2012-02-29 DIAGNOSIS — M25561 Pain in right knee: Secondary | ICD-10-CM

## 2012-02-29 DIAGNOSIS — I10 Essential (primary) hypertension: Secondary | ICD-10-CM | POA: Insufficient documentation

## 2012-02-29 DIAGNOSIS — Z79899 Other long term (current) drug therapy: Secondary | ICD-10-CM | POA: Insufficient documentation

## 2012-02-29 DIAGNOSIS — M25569 Pain in unspecified knee: Secondary | ICD-10-CM

## 2012-02-29 DIAGNOSIS — I2510 Atherosclerotic heart disease of native coronary artery without angina pectoris: Secondary | ICD-10-CM | POA: Insufficient documentation

## 2012-02-29 DIAGNOSIS — E669 Obesity, unspecified: Secondary | ICD-10-CM | POA: Insufficient documentation

## 2012-02-29 DIAGNOSIS — Z8719 Personal history of other diseases of the digestive system: Secondary | ICD-10-CM | POA: Insufficient documentation

## 2012-02-29 DIAGNOSIS — E785 Hyperlipidemia, unspecified: Secondary | ICD-10-CM | POA: Insufficient documentation

## 2012-02-29 LAB — D-DIMER, QUANTITATIVE: D-Dimer, Quant: 0.69 ug/mL-FEU — ABNORMAL HIGH (ref 0.00–0.48)

## 2012-02-29 MED ORDER — HYDROCODONE-ACETAMINOPHEN 5-325 MG PO TABS: 1.0000 | ORAL_TABLET | ORAL | Status: DC | PRN

## 2012-02-29 MED ORDER — HYDROCODONE-ACETAMINOPHEN 5-325 MG PO TABS
1.0000 | ORAL_TABLET | Freq: Once | ORAL | Status: AC
  Administered 2012-02-29: 1 via ORAL
  Filled 2012-02-29: qty 1

## 2012-02-29 NOTE — ED Notes (Signed)
Pain in right knee x1 week. Was seen at [facility] today and sts he was sent here for a "scan" to r/o a blood clot.  They did blood work and "the level was high" so he was sent here for the scan.

## 2012-02-29 NOTE — ED Notes (Signed)
Rt knee pain x 1 month

## 2012-02-29 NOTE — ED Notes (Signed)
Pt. Has had the US done.

## 2012-02-29 NOTE — ED Notes (Signed)
Patient advised to go to [facility] for Ct angiogram and lower duplex per Dr Alvester Morin.

## 2012-02-29 NOTE — ED Provider Notes (Signed)
History     CSN: [id number]  Arrival date & time 02/29/12  2035   First MD Initiated Contact with Patient 02/29/12 2232      Chief Complaint  Patient presents with  . Leg Pain    (Consider location/radiation/quality/duration/timing/severity/associated sxs/prior treatment) HPI Comments: The patient is a 61 year old man with a prior history of paroxysmal atrial fibrillation, coronary artery disease, who had gone to the urgent care Center in [place] today for pain in his right knee. His x-ray of the right knee showed degenerative change. He received a knee injection. And also he had a d-dimer, which was mildly elevated at 0.69. He was therefore referred to [facility] for venous Dopplers to check for deep venous thrombosis.  The history is provided by the patient and medical records. No language interpreter was used.    Past Medical History  Diagnosis Date  . Atrial fibrillation     Slow rate, patient refused Coumadin  . CAD (coronary artery disease) 2007    DES to OM, 2007  . Mitral regurgitation     mild; echo 04/2006  . Dyslipidemia     Low HDL  . HTN (hypertension)   . Obesity   . Low back pain   . History of colonoscopy   . Tobacco abuse   . Ejection fraction     EF 60%, December, 2007  . GI bleeding     2012    Past Surgical History  Procedure Date  . Reattached left index finger   . Colonoscopy   . Heart stint 2007  . Tonsillectomy     Family History  Problem Relation Age of Onset  . Adopted: Yes    History  Substance Use Topics  . Smoking status: Current Every Day Smoker -- 1.0 packs/day for 40 years    Types: Cigarettes  . Smokeless tobacco: Not on file  . Alcohol Use: No      Review of Systems  Constitutional: Negative.   HENT: Negative.   Eyes: Negative.   Respiratory: Negative.   Cardiovascular: Negative.   Gastrointestinal: Negative.   Genitourinary: Negative.   Musculoskeletal: Positive for joint swelling.    Neurological: Negative.   Psychiatric/Behavioral: Negative.     Allergies  Review of patient's allergies indicates no known allergies.  Home Medications   Current Outpatient Rx  Name Route Sig Dispense Refill  . ALBUTEROL SULFATE HFA 108 (90 BASE) MCG/ACT IN AERS Inhalation Inhale 2 puffs into the lungs every 4 (four) hours as needed for wheezing. 1 Inhaler 0  . ATENOLOL 100 MG PO TABS  TAKE ONE TABLET BY MOUTH DAILY 30 tablet 6  . ATORVASTATIN CALCIUM 80 MG PO TABS  TAKE ONE TABLET BY MOUTH DAILY 30 tablet 6  . CLOPIDOGREL BISULFATE 75 MG PO TABS  TAKE ONE TABLET BY MOUTH DAILY 30 tablet 6  . DEXAMETHASONE 1.5 MG PO KIT Oral Take 1 kit (1.5 mg total) by mouth as directed. Begin tomorrow 1 kit 0  . FESOTERODINE FUMARATE ER 4 MG PO TB24 Oral Take by mouth.      Marland Kitchen HYDROCODONE-ACETAMINOPHEN 5-325 MG PO TABS Oral Take 1 tablet by mouth every 4 (four) hours as needed for pain. 20 tablet 0  . IRBESARTAN-HYDROCHLOROTHIAZIDE 150-12.5 MG PO TABS Oral Take 1 tablet by mouth daily. 30 tablet 2  . POTASSIUM CHLORIDE CRYS ER 20 MEQ PO TBCR  TAKE ONE TABLET BY MOUTH DAILY 30 tablet 6  . PREDNISONE 10 MG PO TABS  Take 2 tabs by mouth today, then two tabs twice daily for two days, then one tab twice daily for 2 days, then 1 daily for two days.  Take PC 16 tablet 0  . TRAMADOL-ACETAMINOPHEN 37.5-325 MG PO TABS Oral Take 1 tablet by mouth every 6 (six) hours as needed.        BP 162/95  Pulse 72  Temp 98 F (36.7 C) (Oral)  Resp 20  Ht 5\' 8"  (1.727 m)  Wt 259 lb (117.482 kg)  BMI 39.38 kg/m2  SpO2 98%  Physical Exam  Constitutional: He is oriented to person, place, and time. He appears well-developed and well-nourished. No distress.  HENT:  Head: Normocephalic and atraumatic.  Right Ear: External ear normal.  Left Ear: External ear normal.  Mouth/Throat: Oropharynx is clear and moist.  Eyes: Conjunctivae normal and EOM are normal. Pupils are equal, round, and reactive to light.  Neck:  Normal range of motion. Neck supple.  Cardiovascular: Normal rate, regular rhythm and normal heart sounds.   Pulmonary/Chest: Effort normal and breath sounds normal.  Abdominal: Soft. Bowel sounds are normal.  Musculoskeletal:       Right knee has bony eburnation. There is no palpable pleural effusion. He has a full range of motion in the right knee. There is no calf tenderness and no Homans' sign.   Neurological: He is alert and oriented to person, place, and time.       No sensory or motor deficit.   Skin: Skin is warm and dry.  Psychiatric: He has a normal mood and affect. His behavior is normal.    ED Course  Procedures (including critical care time)  Labs Reviewed - No data to display Dg Knee 1-2 Views Right  02/29/2012  *RADIOLOGY REPORT*  Clinical Data: Medial right knee pain for 2 weeks, no injury  RIGHT KNEE - 1-2 VIEW  Comparison: None.  Findings: There is only slight loss of medial joint space.  No fracture is seen.  However, on the lateral view there does appear to be a small knee joint effusion present.  IMPRESSION:  1.  Mild degenerative disc disease involving the medial compartment. 2.  Probable small right knee joint effusion.   Original Report Authenticated By: Juline Patch, M.D.    US Venous Img Lower Unilateral Right  02/29/2012  *RADIOLOGY REPORT*  Clinical Data: Right knee pain.  RIGHT LOWER EXTREMITY VENOUS DUPLEX ULTRASOUND  Technique:  Gray-scale sonography with graded compression, as well as color Doppler and duplex ultrasound were performed to evaluate the deep venous system of the lower extremity from the level of the common femoral vein through the popliteal and proximal calf veins. Spectral Doppler was utilized to evaluate flow at rest and with distal augmentation maneuvers.  Comparison:  None.  Findings:  Normal compressibility of the common femoral, femoral, and popliteal veins is demonstrated, as well as the visualized proximal calf veins.  No filling defects  to suggest DVT on grayscale or color Doppler imaging.  Doppler waveforms show normal direction of venous flow, normal respiratory phasicity and response to augmentation. The visualized right great saphenous vein is compressible.  IMPRESSION: No evidence of right lower extremity deep vein thrombosis.   Original Report Authenticated By: Richarda Overlie, M.D.      1. Osteoarthritis of knee    Venous dopplers were negative.  Rx hydrocodone-acetaminophen po now and q4h prn pain.  F/U at the [facility] in [place].      Carleene Cooper  III, MD 02/29/12 2304

## 2012-02-29 NOTE — ED Notes (Signed)
MD at bedside. 

## 2012-02-29 NOTE — ED Notes (Signed)
Patient transported to Ultrasound 

## 2012-02-29 NOTE — ED Provider Notes (Signed)
History     CSN: [id number]  Arrival date & time 02/29/12  5284   First MD Initiated Contact with Patient 02/29/12 0940      Chief Complaint  Patient presents with  . Knee Pain    HPI Pt presents today with chief complaint of R knee pain.  Pt states that this has been a recurrent issue for several years, but has had R knee pain flares over the last month.  Pain is primarily anterolateral in nature.  No known trauma.  Pt with baseline hx/o afib. Currently not on anticoagulation 2/2 to hx/o GIB.  Pt states that he has been intermittenly compliant with plavix and other CAD medications.  Has not taken medications over the last 2 days.  No recent prolonged travel.  No calf pain or swelling.  Pt also 1 PPD smoker.  + intermittent knee swelling.   Past Medical History  Diagnosis Date  . Atrial fibrillation     Slow rate, patient refused Coumadin  . CAD (coronary artery disease) 2007    DES to OM, 2007  . Mitral regurgitation     mild; echo 04/2006  . Dyslipidemia     Low HDL  . HTN (hypertension)   . Obesity   . Low back pain   . History of colonoscopy   . Tobacco abuse   . Ejection fraction     EF 60%, December, 2007  . GI bleeding     2012    Past Surgical History  Procedure Date  . Reattached left index finger   . Colonoscopy   . Heart stint 2007  . Tonsillectomy     Family History  Problem Relation Age of Onset  . Adopted: Yes    History  Substance Use Topics  . Smoking status: Current Every Day Smoker -- 1.0 packs/day for 40 years    Types: Cigarettes  . Smokeless tobacco: Not on file  . Alcohol Use: No      Review of Systems  All other systems reviewed and are negative.    Allergies  Review of patient's allergies indicates not on file.  Home Medications   Current Outpatient Rx  Name Route Sig Dispense Refill  . ALBUTEROL SULFATE HFA 108 (90 BASE) MCG/ACT IN AERS Inhalation Inhale 2 puffs into the lungs every 4 (four) hours as needed  for wheezing. 1 Inhaler 0  . ATENOLOL 100 MG PO TABS  TAKE ONE TABLET BY MOUTH DAILY 30 tablet 6  . ATORVASTATIN CALCIUM 80 MG PO TABS  TAKE ONE TABLET BY MOUTH DAILY 30 tablet 6  . CLOPIDOGREL BISULFATE 75 MG PO TABS  TAKE ONE TABLET BY MOUTH DAILY 30 tablet 6  . DEXAMETHASONE 1.5 MG PO KIT Oral Take 1 kit (1.5 mg total) by mouth as directed. Begin tomorrow 1 kit 0  . FESOTERODINE FUMARATE ER 4 MG PO TB24 Oral Take by mouth.      . IRBESARTAN-HYDROCHLOROTHIAZIDE 150-12.5 MG PO TABS Oral Take 1 tablet by mouth daily. 30 tablet 2  . POTASSIUM CHLORIDE CRYS ER 20 MEQ PO TBCR  TAKE ONE TABLET BY MOUTH DAILY 30 tablet 6  . PREDNISONE 10 MG PO TABS  Take 2 tabs by mouth today, then two tabs twice daily for two days, then one tab twice daily for 2 days, then 1 daily for two days.  Take PC 16 tablet 0  . TRAMADOL-ACETAMINOPHEN 37.5-325 MG PO TABS Oral Take 1 tablet by mouth every 6 (six) hours as needed.  BP 174/100  Pulse 79  Temp 97.7 F (36.5 C) (Oral)  Resp 20  Ht 5\' 8"  (1.727 m)  Wt 258 lb (117.028 kg)  BMI 39.23 kg/m2  SpO2 95%  Physical Exam  Constitutional:       Obese    HENT:  Head: Normocephalic and atraumatic.  Eyes: Conjunctivae normal are normal. Pupils are equal, round, and reactive to light.  Neck: Normal range of motion. Neck supple.  Cardiovascular: Normal rate, regular rhythm and normal heart sounds.   Pulmonary/Chest: Effort normal. He has wheezes.  Abdominal: Soft.       Obese abdomen    Musculoskeletal:       + TTP over R anterolateral knee Mild swelling Full ROM mcmurrays mildly positive with lateral mensicus provocative maneuvers.  Mild crepitation with movement.  Calves symmteric  Minimal popliteal tenderness on R     Neurological: He is alert.  Skin: Skin is warm.    ED Course  ASPIRATION BLADDER NEEDLE Performed by: Doree Albee Authorized by: Doree Albee Consent: Verbal consent obtained. Risks and benefits: risks, benefits and  alternatives were discussed Consent given by: patient Patient understanding: patient states understanding of the procedure being performed Preparation: Patient was prepped and draped in the usual sterile fashion. Local anesthesia used: yes Anesthesia method: local ethyl chloride spray  Patient tolerance: Patient tolerated the procedure well with no immediate complications. Comments: R knee aspiration attempted via lateral suprapatellar approach.  Was unable to aspirate any knee fluid.  1 cc of triamcinolone 40,g/ml and 4ccs of lidocaine 2% without epinephrine injected into affected joint with minimal bleeding.     (including critical care time)   Labs Reviewed  D-DIMER, QUANTITATIVE   Dg Knee 1-2 Views Right  02/29/2012  *RADIOLOGY REPORT*  Clinical Data: Medial right knee pain for 2 weeks, no injury  RIGHT KNEE - 1-2 VIEW  Comparison: None.  Findings: There is only slight loss of medial joint space.  No fracture is seen.  However, on the lateral view there does appear to be a small knee joint effusion present.  IMPRESSION:  1.  Mild degenerative disc disease involving the medial compartment. 2.  Probable small right knee joint effusion.   Original Report Authenticated By: Juline Patch, M.D.      1. Right knee pain       MDM  Likely osteoarthritis flare.  Was unable to aspirate any fluid from R knee Therapeutic injection of triamcinolone and lidocaine placed in R knee. Given that pt has a relatively higher risk for thrombotic events as pt has afib,+ smoker, not on anticoagulation and is intermittently compliant with antiplatelet therapy, will check d-dimer to rule out blood clot as source of knee pain. Wells score is fairly low (0-2) which is reassuring. Will proceed with CTA and LE dopplers if indicated.  Discussed weight loss and sports medicine follow up if this becomes a recurrent issue.  Discussed general care and red flags for reevaluation.  Follow up as needed.    CV:    At relatively lengthy discussion about lack of adherence to medication regimen and CV complications.  Currently asymptomatic.  Instructed pt to follow up with PCP/Cardiologist in next 24-48 hours. Pt agreeable.  CV red flags also discussed.      The patient and/or caregiver has been counseled thoroughly with regard to treatment plan and/or medications prescribed including dosage, schedule, interactions, rationale for use, and possible side effects and they verbalize understanding. Diagnoses and expected course of recovery  discussed and will return if not improved as expected or if the condition worsens. Patient and/or caregiver verbalized understanding.              Doree Albee, MD 02/29/12 1125

## 2012-03-23 ENCOUNTER — Other Ambulatory Visit: Payer: Self-pay | Admitting: *Deleted

## 2012-03-23 MED ORDER — CLOPIDOGREL BISULFATE 75 MG PO TABS: 75.0000 mg | ORAL_TABLET | Freq: Every day | ORAL | Status: DC

## 2012-03-23 NOTE — Telephone Encounter (Signed)
Pt needs appointment then refill can be made Fax Received. Refill Completed. [name] (R.M.A)   

## 2012-04-18 ENCOUNTER — Other Ambulatory Visit: Payer: Self-pay

## 2012-04-18 MED ORDER — IRBESARTAN-HYDROCHLOROTHIAZIDE 150-12.5 MG PO TABS: 1.0000 | ORAL_TABLET | Freq: Every day | ORAL | Status: DC

## 2012-06-02 ENCOUNTER — Encounter: Payer: Self-pay | Admitting: Cardiology

## 2012-06-02 ENCOUNTER — Ambulatory Visit (INDEPENDENT_AMBULATORY_CARE_PROVIDER_SITE_OTHER): Payer: PRIVATE HEALTH INSURANCE | Admitting: Cardiology

## 2012-06-02 VITALS — BP 142/80 | HR 78 | Ht 68.0 in | Wt 259.0 lb

## 2012-06-02 DIAGNOSIS — I10 Essential (primary) hypertension: Secondary | ICD-10-CM

## 2012-06-02 DIAGNOSIS — E785 Hyperlipidemia, unspecified: Secondary | ICD-10-CM

## 2012-06-02 DIAGNOSIS — I2510 Atherosclerotic heart disease of native coronary artery without angina pectoris: Secondary | ICD-10-CM

## 2012-06-02 DIAGNOSIS — I4891 Unspecified atrial fibrillation: Secondary | ICD-10-CM

## 2012-06-02 DIAGNOSIS — R0989 Other specified symptoms and signs involving the circulatory and respiratory systems: Secondary | ICD-10-CM

## 2012-06-02 DIAGNOSIS — I059 Rheumatic mitral valve disease, unspecified: Secondary | ICD-10-CM

## 2012-06-02 DIAGNOSIS — K922 Gastrointestinal hemorrhage, unspecified: Secondary | ICD-10-CM

## 2012-06-02 DIAGNOSIS — F17200 Nicotine dependence, unspecified, uncomplicated: Secondary | ICD-10-CM

## 2012-06-02 DIAGNOSIS — IMO0002 Reserved for concepts with insufficient information to code with codable children: Secondary | ICD-10-CM

## 2012-06-02 DIAGNOSIS — R9430 Abnormal result of cardiovascular function study, unspecified: Secondary | ICD-10-CM

## 2012-06-02 DIAGNOSIS — I340 Nonrheumatic mitral (valve) insufficiency: Secondary | ICD-10-CM

## 2012-06-02 MED ORDER — ALBUTEROL SULFATE HFA 108 (90 BASE) MCG/ACT IN AERS: 2.0000 | INHALATION_SPRAY | RESPIRATORY_TRACT | Status: DC | PRN

## 2012-06-02 NOTE — Progress Notes (Signed)
HPI  Patient returns for followup of atrial fibrillation. He's not having any chest pain. He does note that he has felt some irregular heartbeats at time. He cannot describe this any more specifically. He's not had any syncope or presyncope. In the past he has refused Coumadin therapy for his atrial fib. In 2012 he had a GI bleed. Ultimately Plavix was resumed. He is not on aspirin. He refuses Coumadin for his atrial fib. His Plavix is for his coronary disease.  Unfortunately he continues to smoke. I counseled him to stop.  No Known Allergies  Current Outpatient Prescriptions  Medication Sig Dispense Refill  . albuterol (PROVENTIL HFA;VENTOLIN HFA) 108 (90 BASE) MCG/ACT inhaler Inhale 2 puffs into the lungs every 4 (four) hours as needed for wheezing.  1 Inhaler  5  . atenolol (TENORMIN) 100 MG tablet TAKE ONE TABLET BY MOUTH DAILY  30 tablet  6  . atorvastatin (LIPITOR) 80 MG tablet TAKE ONE TABLET BY MOUTH DAILY  30 tablet  6  . clopidogrel (PLAVIX) 75 MG tablet Take 1 tablet (75 mg total) by mouth daily.  30 tablet  1  . fesoterodine (TOVIAZ) 4 MG TB24 Take 4 mg by mouth daily.       . irbesartan-hydrochlorothiazide (AVALIDE) 150-12.5 MG per tablet Take 1 tablet by mouth daily.  30 tablet  1  . mirabegron ER (MYRBETRIQ) 50 MG TB24 Take 50 mg by mouth daily.      . pantoprazole (PROTONIX) 40 MG tablet Take 40 mg by mouth daily.      . potassium chloride SA (K-DUR,KLOR-CON) 20 MEQ tablet TAKE ONE TABLET BY MOUTH DAILY  30 tablet  6  . Tamsulosin HCl (FLOMAX) 0.4 MG CAPS Take 0.4 mg by mouth daily.        History   Social History  . Marital Status: Married    Spouse Name: N/A    Number of Children: N/A  . Years of Education: N/A   Occupational History  . Not on file.   Social History Main Topics  . Smoking status: Current Every Day Smoker -- 1.0 packs/day for 40 years    Types: Cigarettes  . Smokeless tobacco: Not on file  . Alcohol Use: No  . Drug Use: No  . Sexually  Active: Not on file   Other Topics Concern  . Not on file   Social History Narrative  . No narrative on file    Family History  Problem Relation Age of Onset  . Adopted: Yes    Past Medical History  Diagnosis Date  . Atrial fibrillation     Slow rate, patient refused Coumadin  . CAD (coronary artery disease) 2007    DES to OM, 2007  . Mitral regurgitation     mild; echo 04/2006  . Dyslipidemia     Low HDL  . HTN (hypertension)   . Obesity   . Low back pain   . History of colonoscopy   . Tobacco abuse   . Ejection fraction     EF 60%, December, 2007  . GI bleeding     2012    Past Surgical History  Procedure Date  . Reattached left index finger   . Colonoscopy   . Heart stint 2007  . Tonsillectomy     Patient Active Problem List  Diagnosis  . OVERWEIGHT  . TOBACCO ABUSE  . LOW BACK PAIN  . DERMATITIS, FEET  . MUSCLE STRAIN, INTERCOSTAL  . UPPER GASTROINTESTINAL HEMORRHAGE  .  Atrial fibrillation  . CAD (coronary artery disease)  . Mitral regurgitation  . Dyslipidemia  . HTN (hypertension)  . Ejection fraction  . GI bleeding  . Severe lt groin pain  . Severe right groin pain  . OSTEITIS CONDENSANS    ROS   Patient denies fever, chills, headache, sweats, rash, change in vision, change in hearing, chest pain, cough, nausea vomiting, urinary symptoms. All other systems are reviewed and are negative.  PHYSICAL EXAM   Patient is significantly overweight. He is here with his wife.There is no jugular venous distention. Lungs are clear. Respiratory effort is nonlabored. Cardiac exam reveals S1 and S2. The rhythm is irregularly irregular. The abdomen is soft. There is no peripheral edema. There no musculoskeletal deformities. There are no skin rashes.  Filed Vitals:   06/02/12 1136  BP: 142/80  Pulse: 78  Height: 5\' 8"  (1.727 m)  Weight: 259 lb (117.482 kg)   EKG is done and reviewed by me. He has coarse atrial fibrillation. Scattered PVCs are noted.  There is decreased anterior R wave progression. There is no significant change since a tracing of August, 2012.  ASSESSMENT & PLAN

## 2012-06-02 NOTE — Assessment & Plan Note (Signed)
In the office today his atrial fib rate is controlled.  He says that at times he feels marked palpitations. We need to see if he is having marked swings in his atrial fib rate. He will wear a 48 hour Holter and I will review it carefully. I'll be in touch with him with the information. Unless other plans are needed, I will see him back in 5-6 months to review everything and how he is feeling

## 2012-06-02 NOTE — Assessment & Plan Note (Signed)
There was mild mitral regurgitation in 2007. Echocardiogram we'll reassess the severity of his MR.

## 2012-06-02 NOTE — Assessment & Plan Note (Signed)
Historically left ventricular function is normal. Currently he has fatigue and some palpitations. His last echo was done over 5 years ago in 2007. Two-dimensional echo be done to reassess LV function and RV function.

## 2012-06-02 NOTE — Patient Instructions (Addendum)
Your physician recommends that you schedule a follow-up appointment in: 5 months.    Your physician has requested that you have an echocardiogram. Echocardiography is a painless test that uses sound waves to create images of your heart. It provides your doctor with information about the size and shape of your heart and how well your heart's chambers and valves are working. This procedure takes approximately one hour. There are no restrictions for this procedure.  Your physician has recommended that you wear a holter monitor. Holter monitors are medical devices that record the heart's electrical activity. Doctors most often use these monitors to diagnose arrhythmias. Arrhythmias are problems with the speed or rhythm of the heartbeat. The monitor is a small, portable device. You can wear one while you do your normal daily activities. This is usually used to diagnose what is causing palpitations/syncope (passing out).  You need to remain on Plavix and off aspirin per Dr Myrtis Ser

## 2012-06-02 NOTE — Assessment & Plan Note (Signed)
The patient continues to smoke. He says he is smoking less in the past. I have counseled him to try to stop.

## 2012-06-02 NOTE — Assessment & Plan Note (Signed)
The patient does have known coronary disease. Plavix discontinued instead of aspirin because of his GI bleeding. He did receive a drug-eluting stent in 2007. There is no exercise testing since then. This will be considered over time.

## 2012-06-02 NOTE — Assessment & Plan Note (Addendum)
Currently blood pressure is controlled. No change in therapy.  As part of today's visit I spent greater than 25 minutes with the patient and his wife concerning his records and all of his data. I spent more than half of this time with direct contact with him and his wife. We had a long discussion about whether he would be willing to consider Coumadin. We talked about the rationale for continuing Plavix for him. We talked about the testing to be done.

## 2012-06-02 NOTE — Assessment & Plan Note (Signed)
There is a history of upper GI bleed in the past. Decision has been made over time to continue Plavix for his coronary disease but not use aspirin. In addition he refuses Coumadin for his atrial fibrillation. Fortunately this makes it easy her to treat his history of a GI bleed.

## 2012-06-02 NOTE — Assessment & Plan Note (Signed)
Patient is receiving maximum dose of atorvastatin for his lipids.

## 2012-06-16 ENCOUNTER — Ambulatory Visit (INDEPENDENT_AMBULATORY_CARE_PROVIDER_SITE_OTHER): Payer: PRIVATE HEALTH INSURANCE

## 2012-06-16 ENCOUNTER — Ambulatory Visit (HOSPITAL_COMMUNITY): Payer: PRIVATE HEALTH INSURANCE | Attending: Internal Medicine | Admitting: Radiology

## 2012-06-16 DIAGNOSIS — I10 Essential (primary) hypertension: Secondary | ICD-10-CM | POA: Insufficient documentation

## 2012-06-16 DIAGNOSIS — I4891 Unspecified atrial fibrillation: Secondary | ICD-10-CM | POA: Insufficient documentation

## 2012-06-16 DIAGNOSIS — E785 Hyperlipidemia, unspecified: Secondary | ICD-10-CM | POA: Insufficient documentation

## 2012-06-16 DIAGNOSIS — I080 Rheumatic disorders of both mitral and aortic valves: Secondary | ICD-10-CM | POA: Insufficient documentation

## 2012-06-16 DIAGNOSIS — I2510 Atherosclerotic heart disease of native coronary artery without angina pectoris: Secondary | ICD-10-CM | POA: Insufficient documentation

## 2012-06-16 NOTE — Progress Notes (Signed)
Echocardiogram performed.  

## 2012-06-16 NOTE — Progress Notes (Signed)
Placed a 24 hr holter monitor on patient and went over instructions on how to use it and when to return it.

## 2012-06-17 ENCOUNTER — Encounter: Payer: Self-pay | Admitting: Cardiology

## 2012-06-17 DIAGNOSIS — R9430 Abnormal result of cardiovascular function study, unspecified: Secondary | ICD-10-CM | POA: Insufficient documentation

## 2012-06-17 DIAGNOSIS — I340 Nonrheumatic mitral (valve) insufficiency: Secondary | ICD-10-CM | POA: Insufficient documentation

## 2012-06-28 ENCOUNTER — Telehealth: Payer: Self-pay

## 2012-06-28 ENCOUNTER — Other Ambulatory Visit: Payer: Self-pay

## 2012-06-28 MED ORDER — CLOPIDOGREL BISULFATE 75 MG PO TABS: 75.0000 mg | ORAL_TABLET | Freq: Every day | ORAL | Status: AC

## 2012-06-28 NOTE — Telephone Encounter (Signed)
Call patient about replacement of holter monitor

## 2012-07-07 ENCOUNTER — Ambulatory Visit: Payer: PRIVATE HEALTH INSURANCE

## 2012-07-07 DIAGNOSIS — I4891 Unspecified atrial fibrillation: Secondary | ICD-10-CM

## 2012-07-07 NOTE — Progress Notes (Signed)
Placed a 24 hr monitor on patient and went over instructions on how to use it and when to return it. This was a redo because the last one did not work.

## 2012-07-14 ENCOUNTER — Other Ambulatory Visit: Payer: Self-pay

## 2012-07-14 MED ORDER — ATORVASTATIN CALCIUM 80 MG PO TABS: 80.0000 mg | ORAL_TABLET | Freq: Every day | ORAL | Status: AC

## 2012-07-14 MED ORDER — ATENOLOL 100 MG PO TABS: 100.0000 mg | ORAL_TABLET | Freq: Every day | ORAL | Status: AC

## 2012-07-14 MED ORDER — POTASSIUM CHLORIDE CRYS ER 20 MEQ PO TBCR: 20.0000 meq | EXTENDED_RELEASE_TABLET | Freq: Every day | ORAL | Status: AC

## 2012-07-26 ENCOUNTER — Other Ambulatory Visit: Payer: Self-pay | Admitting: *Deleted

## 2012-07-26 MED ORDER — IRBESARTAN-HYDROCHLOROTHIAZIDE 150-12.5 MG PO TABS: 1.0000 | ORAL_TABLET | Freq: Every day | ORAL | Status: DC

## 2012-09-13 ENCOUNTER — Emergency Department (INDEPENDENT_AMBULATORY_CARE_PROVIDER_SITE_OTHER): Payer: PRIVATE HEALTH INSURANCE

## 2012-09-13 ENCOUNTER — Encounter: Payer: Self-pay | Admitting: *Deleted

## 2012-09-13 ENCOUNTER — Emergency Department (INDEPENDENT_AMBULATORY_CARE_PROVIDER_SITE_OTHER)
Admission: EM | Admit: 2012-09-13 | Discharge: 2012-09-13 | Disposition: A | Discharge status: Home / Self Care | Payer: PRIVATE HEALTH INSURANCE | Source: Home / Self Care | Attending: Family Medicine | Admitting: Family Medicine

## 2012-09-13 DIAGNOSIS — R0602 Shortness of breath: Secondary | ICD-10-CM

## 2012-09-13 DIAGNOSIS — R05 Cough: Secondary | ICD-10-CM

## 2012-09-13 DIAGNOSIS — R059 Cough, unspecified: Secondary | ICD-10-CM

## 2012-09-13 DIAGNOSIS — J209 Acute bronchitis, unspecified: Secondary | ICD-10-CM

## 2012-09-13 LAB — POCT CBC W AUTO DIFF (K'VILLE URGENT CARE)

## 2012-09-13 MED ORDER — PREDNISONE 20 MG PO TABS: 20.0000 mg | ORAL_TABLET | Freq: Two times a day (BID) | ORAL | Status: DC

## 2012-09-13 MED ORDER — DOXYCYCLINE HYCLATE 100 MG PO CAPS: 100.0000 mg | ORAL_CAPSULE | Freq: Two times a day (BID) | ORAL | Status: DC

## 2012-09-13 NOTE — ED Notes (Signed)
Pt c/o productive cough and SOB x last night. Denies fever. No OTC meds.

## 2012-09-13 NOTE — ED Provider Notes (Signed)
History     CSN: [id number]  Arrival date & time 09/13/12  9562   First MD Initiated Contact with Patient 09/13/12 1038      Chief Complaint  Patient presents with  . Cough  . Shortness of Breath       HPI Comments: Patient complains of developing fatigue yesterday, and intermittent shortness of breath and wheezing at rest and activity.  He has developed a non-productive cough.  He notes that his albuterol MDI improves his symptoms.  He denies fevers, chills, and sweats.  No pleuritic pain. He continues to smoke.   Past Medical History  Diagnosis Date  . Atrial fibrillation     Slow rate, patient refused Coumadin  . CAD (coronary artery disease) 2007    DES to OM, 2007  . Mitral regurgitation     mild; echo 04/2006  //   Mild, echo, February, 2014  . Dyslipidemia     Low HDL  . HTN (hypertension)   . Obesity   . Low back pain   . History of colonoscopy   . Tobacco abuse   . Ejection fraction     EF 60%, December, 2007  //   EF 60-65%, echo, February, 2014  . GI bleeding     2012    Past Surgical History  Procedure Laterality Date  . Reattached left index finger    . Colonoscopy    . Heart stint  2007  . Tonsillectomy      Family History  Problem Relation Age of Onset  . Adopted: Yes    History  Substance Use Topics  . Smoking status: Current Every Day Smoker -- 1.00 packs/day for 40 years    Types: Cigarettes  . Smokeless tobacco: Not on file  . Alcohol Use: No      Review of Systems No sore throat + cough No pleuritic pain + wheezing + nasal congestion ? post-nasal drainage No sinus pain/pressure No itchy/red eyes No earache No hemoptysis + SOB with activity No fever/chills No nausea No vomiting No abdominal pain No diarrhea No urinary symptoms No skin rashes + fatigue No myalgias No headache    Allergies  Review of patient's allergies indicates no known allergies.  Home Medications   Current Outpatient Rx  Name  Route  Sig   Dispense  Refill  . albuterol (PROVENTIL HFA;VENTOLIN HFA) 108 (90 BASE) MCG/ACT inhaler   Inhalation   Inhale 2 puffs into the lungs every 4 (four) hours as needed for wheezing.   1 Inhaler   5   . atenolol (TENORMIN) 100 MG tablet   Oral   Take 1 tablet (100 mg total) by mouth daily.   30 tablet   6   . atorvastatin (LIPITOR) 80 MG tablet   Oral   Take 1 tablet (80 mg total) by mouth daily.   30 tablet   6   . clopidogrel (PLAVIX) 75 MG tablet   Oral   Take 1 tablet (75 mg total) by mouth daily.   30 tablet   11   . doxycycline (VIBRAMYCIN) 100 MG capsule   Oral   Take 1 capsule (100 mg total) by mouth 2 (two) times daily.   20 capsule   0   . fesoterodine (TOVIAZ) 4 MG TB24   Oral   Take 4 mg by mouth daily.          . irbesartan-hydrochlorothiazide (AVALIDE) 150-12.5 MG per tablet   Oral   Take 1 tablet  by mouth daily.   30 tablet   6   . mirabegron ER (MYRBETRIQ) 50 MG TB24   Oral   Take 50 mg by mouth daily.         . pantoprazole (PROTONIX) 40 MG tablet   Oral   Take 40 mg by mouth daily.         . potassium chloride SA (K-DUR,KLOR-CON) 20 MEQ tablet   Oral   Take 1 tablet (20 mEq total) by mouth daily.   30 tablet   6   . predniSONE (DELTASONE) 20 MG tablet   Oral   Take 1 tablet (20 mg total) by mouth 2 (two) times daily. Take with food.   10 tablet   0   . Tamsulosin HCl (FLOMAX) 0.4 MG CAPS   Oral   Take 0.4 mg by mouth daily.           BP 165/96  Pulse 67  Temp(Src) 98.3 F (36.8 C) (Oral)  Resp 18  Ht 5\' 8"  (1.727 m)  Wt 257 lb (116.574 kg)  BMI 39.09 kg/m2  SpO2 95%  Physical Exam Nursing notes and Vital Signs reviewed. Appearance:  Patient appears stated age, and in no acute distress.  Patient is obese (BMI 39.1) Eyes:  Pupils are equal, round, and reactive to light and accomodation.  Extraocular movement is intact.  Conjunctivae are not inflamed  Ears:  Canals normal.  Tympanic membranes normal.  Nose:  Mildly  congested turbinates.  No sinus tenderness.   Pharynx:  Normal Neck:  Supple.  No adenopathy  Lungs:   Faint wheezes right posterior base.  Breath sounds are equal.  Heart:  Generally regular rate and rhythm without murmurs, rubs, or gallops.  Abdomen:  Nontender without masses or hepatosplenomegaly.  Bowel sounds are present.  No CVA or flank tenderness.  Extremities:  No edema.  No calf tenderness Skin:  No rash present.   ED Course  Procedures  none  Labs Reviewed  POCT CBC W AUTO DIFF ([facility])  WBC 10.0; LY 15.3; MO 6.8; GR 77.9; Hgb 16.8; Platelets 184    Dg Chest 2 View  09/13/2012  *RADIOLOGY REPORT*  Clinical Data: Cough, shortness of breath  CHEST - 2 VIEW  Comparison: 04/14/2006  Findings: Borderline cardiomegaly.  No acute infiltrate or pleural effusion.  No pulmonary edema.  Mild degenerative changes thoracic spine.  IMPRESSION: Borderline cardiomegaly.  No active disease.   Original Report Authenticated By: Natasha Mead, M.D.      1. Acute bronchitis.  Chart reviewed; note increase in white blood count (patient's usual WBC about 7.7 to 8.0)       MDM  Patient's chart reviewed Begin doxycycline and prednisone burst.  Avoid sun exposure while taking doxycycline Take plain Mucinex (guaifenesin) twice daily for cough and congestion.  Increase fluid intake, rest. May continue albuterol inhaler as needed. May take Delsym Cough Suppressant at bedtime for nighttime cough.  Followup with pulmonologist if not improving one week.         Lattie Haw, MD 09/19/12 [phone]

## 2012-11-02 ENCOUNTER — Encounter: Payer: Self-pay | Admitting: Cardiology

## 2012-11-02 ENCOUNTER — Ambulatory Visit (INDEPENDENT_AMBULATORY_CARE_PROVIDER_SITE_OTHER): Payer: PRIVATE HEALTH INSURANCE | Admitting: Cardiology

## 2012-11-02 ENCOUNTER — Ambulatory Visit: Payer: PRIVATE HEALTH INSURANCE | Admitting: Cardiology

## 2012-11-02 VITALS — BP 152/92 | HR 53 | Ht 68.0 in | Wt 254.0 lb

## 2012-11-02 DIAGNOSIS — F17200 Nicotine dependence, unspecified, uncomplicated: Secondary | ICD-10-CM

## 2012-11-02 DIAGNOSIS — I2510 Atherosclerotic heart disease of native coronary artery without angina pectoris: Secondary | ICD-10-CM

## 2012-11-02 DIAGNOSIS — I4891 Unspecified atrial fibrillation: Secondary | ICD-10-CM

## 2012-11-02 DIAGNOSIS — R0989 Other specified symptoms and signs involving the circulatory and respiratory systems: Secondary | ICD-10-CM

## 2012-11-02 DIAGNOSIS — R9430 Abnormal result of cardiovascular function study, unspecified: Secondary | ICD-10-CM

## 2012-11-02 DIAGNOSIS — I10 Essential (primary) hypertension: Secondary | ICD-10-CM

## 2012-11-02 NOTE — Assessment & Plan Note (Signed)
Patient is on meds for his blood pressure. No change in therapy.

## 2012-11-02 NOTE — Patient Instructions (Addendum)
**Note De-identified Anwitha Mapes Obfuscation** Your physician recommends that you continue on your current medications as directed. Please refer to the Current Medication list given to you today.  Your physician wants you to follow-up in: 1 year. You will receive a reminder letter in the mail two months in advance. If you don't receive a letter, please call our office to schedule the follow-up appointment.  

## 2012-11-02 NOTE — Assessment & Plan Note (Signed)
Coronary disease is stable. No change in therapy. 

## 2012-11-02 NOTE — Assessment & Plan Note (Signed)
I have counseled him to stop. 

## 2012-11-02 NOTE — Assessment & Plan Note (Signed)
Patient has chronic atrial fibrillation. The rate is controlled. He has had GI bleeding in the past. Overall he refuses Coumadin regardless. I mentioned to him that there are newer agents that could be considered. He still does not want to proceed with anticoagulation. No change in therapy.

## 2012-11-02 NOTE — Assessment & Plan Note (Signed)
He has normal left ventricular function by echo, February, 2014, ejection fraction was 60-65%.

## 2012-11-02 NOTE — Progress Notes (Signed)
HPI  Patient is seen today to followup atrial fibrillation. I saw him last January, 2014. At that time the decision was made for him to wear a Holter to assess his heart rate. His heart rate was controlled in the range of 70-80. He did not have any marked bradycardia or tachycardia. He did have one episode of heat wide complex beats with a rate of 150. He has normal left ventricular function. Two-dimensional echo was also done. This showed normal left jugular function.  Overall he says he has general fatigue. He has shortness of breath. He continues to smoke.  No Known Allergies  Current Outpatient Prescriptions  Medication Sig Dispense Refill  . albuterol (PROVENTIL HFA;VENTOLIN HFA) 108 (90 BASE) MCG/ACT inhaler Inhale 2 puffs into the lungs every 4 (four) hours as needed for wheezing.  1 Inhaler  5  . atenolol (TENORMIN) 100 MG tablet Take 1 tablet (100 mg total) by mouth daily.  30 tablet  6  . atorvastatin (LIPITOR) 80 MG tablet Take 1 tablet (80 mg total) by mouth daily.  30 tablet  6  . clopidogrel (PLAVIX) 75 MG tablet Take 1 tablet (75 mg total) by mouth daily.  30 tablet  11  . doxycycline (VIBRAMYCIN) 100 MG capsule Take 1 capsule (100 mg total) by mouth 2 (two) times daily.  20 capsule  0  . fesoterodine (TOVIAZ) 4 MG TB24 Take 4 mg by mouth daily.       . irbesartan-hydrochlorothiazide (AVALIDE) 150-12.5 MG per tablet Take 1 tablet by mouth daily.  30 tablet  6  . mirabegron ER (MYRBETRIQ) 50 MG TB24 Take 50 mg by mouth daily.      . pantoprazole (PROTONIX) 40 MG tablet Take 40 mg by mouth daily.      . potassium chloride SA (K-DUR,KLOR-CON) 20 MEQ tablet Take 1 tablet (20 mEq total) by mouth daily.  30 tablet  6  . predniSONE (DELTASONE) 20 MG tablet Take 1 tablet (20 mg total) by mouth 2 (two) times daily. Take with food.  10 tablet  0  . Tamsulosin HCl (FLOMAX) 0.4 MG CAPS Take 0.4 mg by mouth daily.       No current facility-administered medications for this visit.     History   Social History  . Marital Status: Married    Spouse Name: N/A    Number of Children: N/A  . Years of Education: N/A   Occupational History  . Not on file.   Social History Main Topics  . Smoking status: Current Every Day Smoker -- 1.00 packs/day for 40 years    Types: Cigarettes  . Smokeless tobacco: Not on file  . Alcohol Use: No  . Drug Use: No  . Sexually Active: Not on file   Other Topics Concern  . Not on file   Social History Narrative  . No narrative on file    Family History  Problem Relation Age of Onset  . Adopted: Yes    Past Medical History  Diagnosis Date  . Atrial fibrillation     Slow rate, patient refused Coumadin  . CAD (coronary artery disease) 2007    DES to OM, 2007  . Mitral regurgitation     mild; echo 04/2006  //   Mild, echo, February, 2014  . Dyslipidemia     Low HDL  . HTN (hypertension)   . Obesity   . Low back pain   . History of colonoscopy   . Tobacco abuse   .  Ejection fraction     EF 60%, December, 2007  //   EF 60-65%, echo, February, 2014  . GI bleeding     2012    Past Surgical History  Procedure Laterality Date  . Reattached left index finger    . Colonoscopy    . Heart stint  2007  . Tonsillectomy      Patient Active Problem List   Diagnosis Date Noted  . Dyslipidemia     Priority: High  . TOBACCO ABUSE 03/22/2009    Priority: High  . Ejection fraction   . Mitral regurgitation   . Severe lt groin pain 04/01/2011  . Severe right groin pain 04/01/2011  . OSTEITIS CONDENSANS 01/12/2011  . Atrial fibrillation   . CAD (coronary artery disease)   . HTN (hypertension)   . UPPER GASTROINTESTINAL HEMORRHAGE 07/19/2010  . MUSCLE STRAIN, INTERCOSTAL 07/04/2010  . DERMATITIS, FEET 04/28/2010  . OVERWEIGHT 03/22/2009  . LOW BACK PAIN 02/16/2007    ROS   Patient denies fever, chills, headache, sweats, rash, change in vision, change in hearing, chest pain, nausea vomiting, urinary symptoms. All  other systems are reviewed and are negative.  PHYSICAL EXAM  Patient is oriented to person time and place. Affect is normal. He's here with his wife. The patient is overweight. He is stable. There is no jugular venous distention. Lungs reveal decreased breath sounds. There is no respiratory distress. Cardiac exam reveals S1 and S2. There no significant murmurs heard. The abdomen is soft. There is no significant peripheral edema.  Filed Vitals:   11/02/12 0922  BP: 152/92  Pulse: 53  Height: 5\' 8"  (1.727 m)  Weight: 254 lb (115.214 kg)     ASSESSMENT & PLAN

## 2013-04-07 ENCOUNTER — Encounter: Payer: Self-pay | Admitting: Emergency Medicine

## 2013-04-07 ENCOUNTER — Emergency Department (INDEPENDENT_AMBULATORY_CARE_PROVIDER_SITE_OTHER): Payer: PRIVATE HEALTH INSURANCE

## 2013-04-07 ENCOUNTER — Emergency Department
Admission: EM | Admit: 2013-04-07 | Discharge: 2013-04-07 | Disposition: A | Discharge status: Home / Self Care | Payer: PRIVATE HEALTH INSURANCE | Source: Home / Self Care | Attending: Family Medicine | Admitting: Family Medicine

## 2013-04-07 DIAGNOSIS — R0609 Other forms of dyspnea: Secondary | ICD-10-CM

## 2013-04-07 DIAGNOSIS — I517 Cardiomegaly: Secondary | ICD-10-CM

## 2013-04-07 DIAGNOSIS — R0600 Dyspnea, unspecified: Secondary | ICD-10-CM

## 2013-04-07 DIAGNOSIS — I10 Essential (primary) hypertension: Secondary | ICD-10-CM

## 2013-04-07 DIAGNOSIS — R05 Cough: Secondary | ICD-10-CM

## 2013-04-07 LAB — POCT CBC W AUTO DIFF (K'VILLE URGENT CARE)

## 2013-04-07 MED ORDER — IPRATROPIUM-ALBUTEROL 20-100 MCG/ACT IN AERS: 1.0000 | INHALATION_SPRAY | Freq: Four times a day (QID) | RESPIRATORY_TRACT | Status: AC | PRN

## 2013-04-07 NOTE — ED Provider Notes (Signed)
CSN: [id number]     Arrival date & time 04/07/13  0941 History   First MD Initiated Contact with Patient 04/07/13 1003     Chief Complaint  Patient presents with  . Cough    x 1 month  . Wheezing    x 1 month  . Shortness of Breath    x 1 month      HPI Comments: Patient complains of increased shortness of breath and occasional wheezing for one month.  No increase in cough, and sputum is clear.  He has difficulty falling asleep, and awakens with shortness of breath at night.  He sometimes feels shortness of breath at rest.  He states that he snores, but denies daytime sleepiness.  No chest pain.  No fevers, chills, and sweats.  He notes that albuterol MDI is helpful.  He continues to smoke.  The history is provided by the patient.    Past Medical History  Diagnosis Date  . Atrial fibrillation     Slow rate, patient refused Coumadin  . CAD (coronary artery disease) 2007    DES to OM, 2007  . Mitral regurgitation     mild; echo 04/2006  //   Mild, echo, February, 2014  . Dyslipidemia     Low HDL  . HTN (hypertension)   . Obesity   . Low back pain   . History of colonoscopy   . Tobacco abuse   . Ejection fraction     EF 60%, December, 2007  //   EF 60-65%, echo, February, 2014  . GI bleeding     2012   Past Surgical History  Procedure Laterality Date  . Reattached left index finger    . Colonoscopy    . Heart stint  2007  . Tonsillectomy     Family History  Problem Relation Age of Onset  . Adopted: Yes   History  Substance Use Topics  . Smoking status: Current Every Day Smoker -- 1.00 packs/day for 40 years    Types: Cigarettes  . Smokeless tobacco: Not on file  . Alcohol Use: No    Review of Systems No sore throat + cough No pleuritic pain Occasional wheezing No nasal congestion No post-nasal drainage No sinus pain/pressure No itchy/red eyes No earache No hemoptysis + SOB No fever/chills No nausea No vomiting No abdominal pain No diarrhea No  urinary symptoms No skin rash + fatigue No myalgias No headache Used OTC meds without relief  Allergies  Review of patient's allergies indicates no known allergies.  Home Medications   Current Outpatient Rx  Name  Route  Sig  Dispense  Refill  . atenolol (TENORMIN) 100 MG tablet   Oral   Take 1 tablet (100 mg total) by mouth daily.   30 tablet   6   . atorvastatin (LIPITOR) 80 MG tablet   Oral   Take 1 tablet (80 mg total) by mouth daily.   30 tablet   6   . clopidogrel (PLAVIX) 75 MG tablet   Oral   Take 1 tablet (75 mg total) by mouth daily.   30 tablet   11   . fesoterodine (TOVIAZ) 4 MG TB24   Oral   Take 4 mg by mouth daily.          . irbesartan-hydrochlorothiazide (AVALIDE) 150-12.5 MG per tablet   Oral   Take 1 tablet by mouth daily.   30 tablet   6   . mirabegron ER (MYRBETRIQ) 50 MG  TB24   Oral   Take 50 mg by mouth daily.         . pantoprazole (PROTONIX) 40 MG tablet   Oral   Take 40 mg by mouth daily.         . potassium chloride SA (K-DUR,KLOR-CON) 20 MEQ tablet   Oral   Take 1 tablet (20 mEq total) by mouth daily.   30 tablet   6   . Tamsulosin HCl (FLOMAX) 0.4 MG CAPS   Oral   Take 0.4 mg by mouth daily.         . Ipratropium-Albuterol (COMBIVENT) 20-100 MCG/ACT AERS respimat   Inhalation   Inhale 1 puff into the lungs every 6 (six) hours as needed for shortness of breath.   1 Inhaler   2    BP 160/99  Pulse 77  Temp(Src) 97.5 F (36.4 C) (Oral)  Ht 5\' 8"  (1.727 m)  Wt 262 lb (118.842 kg)  BMI 39.85 kg/m2  SpO2 95% Physical Exam Nursing notes and Vital Signs reviewed. Appearance:  Patient appears stated age, and in no acute distress. Patient is obese (BMI 39.9) Eyes:  Pupils are equal, round, and reactive to light and accomodation.  Extraocular movement is intact.  Conjunctivae are not inflamed  Ears:  Canals normal.  Tympanic membranes normal.  Nose:  Mildly congested turbinates.  No sinus tenderness.   Pharynx:   Normal; moist mucous membranes  Neck:  Supple.  No adenopathy Lungs:  Clear to auscultation.  Breath sounds are equal.  Heart:  Regular rate and rhythm without murmurs, rubs, or gallops.  Abdomen:  Nontender without masses or hepatosplenomegaly.  Bowel sounds are present.  No CVA or flank tenderness.  Extremities:  2+ bilateral pitting edema.  No calf tenderness Skin:  No rash present.   ED Course  Procedures  none    Labs Reviewed  POCT CBC W AUTO DIFF ([facility]):  WBC 10.0; LY15.6; MO 5.3; GR 79.1; Hgb 17.9; Platelets 208    Imaging Review Dg Chest 2 View  04/07/2013   CLINICAL DATA:  Cough and dyspnea for 1 month, history of smoking  EXAM: CHEST  2 VIEW  COMPARISON:  09/13/2012; 04/14/2006  FINDINGS: Examination is degraded due to patient body habitus.  Grossly unchanged enlarged cardiac silhouette and mediastinal contours. Evaluation of retrosternal clear space is obscured secondary to overlying soft tissues. Apparent veiling opacities overlying the bilateral mid and lower lungs are favored to represent overlying breast tissue. The lungs remain hyperexpanded with flattening of the bilateral hemidiaphragms. This grossly unchanged mild diffuse slightly nodular thickening of the pulmonary interstitium. No definitive focal airspace opacities. No pleural effusion or pneumothorax. No evidence of edema. Unchanged bones including mild (approximately 25%) compression deformity involving a lower thoracic vertebral body with associated mild kyphosis at this level.  IMPRESSION: Grossly stable findings of cardiomegaly, lung hyperexpansion and bronchitic change without acute cardiopulmonary disease.   Electronically Signed   By: Simonne Come M.D.   On: 04/07/2013 10:39      MDM   1. Dyspnea; suspect COPD, possibly sleep apnea  Hypertension, uncontrolled   Chart review reveals Hgb 16.8 on 09/13/12.  Today's CBC reveals Hgb 17.9 suggesting increasing hypoxia.  Patient may benefit from nocturnal  O2.  Possible sleep apnea may be exacerbating his uncontrolled hypertension.  Discontinue albuterol and begin trial of ipratropium-albuterol inhaler. Recommend discontinuing smoking. Recommend follow-up with pulmonologist for evaluation and management.    Lattie Haw, MD 04/08/13 [phone]

## 2013-04-07 NOTE — ED Notes (Addendum)
[name] complains of cough with clear sputum, wheezing and shortness of breath for 1 week. He reports the cough is worse at night. Denies fever, chills or sweats.

## 2013-06-01 ENCOUNTER — Other Ambulatory Visit: Payer: Self-pay

## 2013-06-01 MED ORDER — IRBESARTAN-HYDROCHLOROTHIAZIDE 150-12.5 MG PO TABS: 1.0000 | ORAL_TABLET | Freq: Every day | ORAL | Status: AC

## 2013-06-19 IMAGING — CR DG CHEST 2V
2 series · 2 of 2 positions shown · non-contrast
Comparison: 04/14/2006

CLINICAL DATA: Cough, shortness of breath

CHEST - 2 VIEW

[view not recorded (1 of 2)]
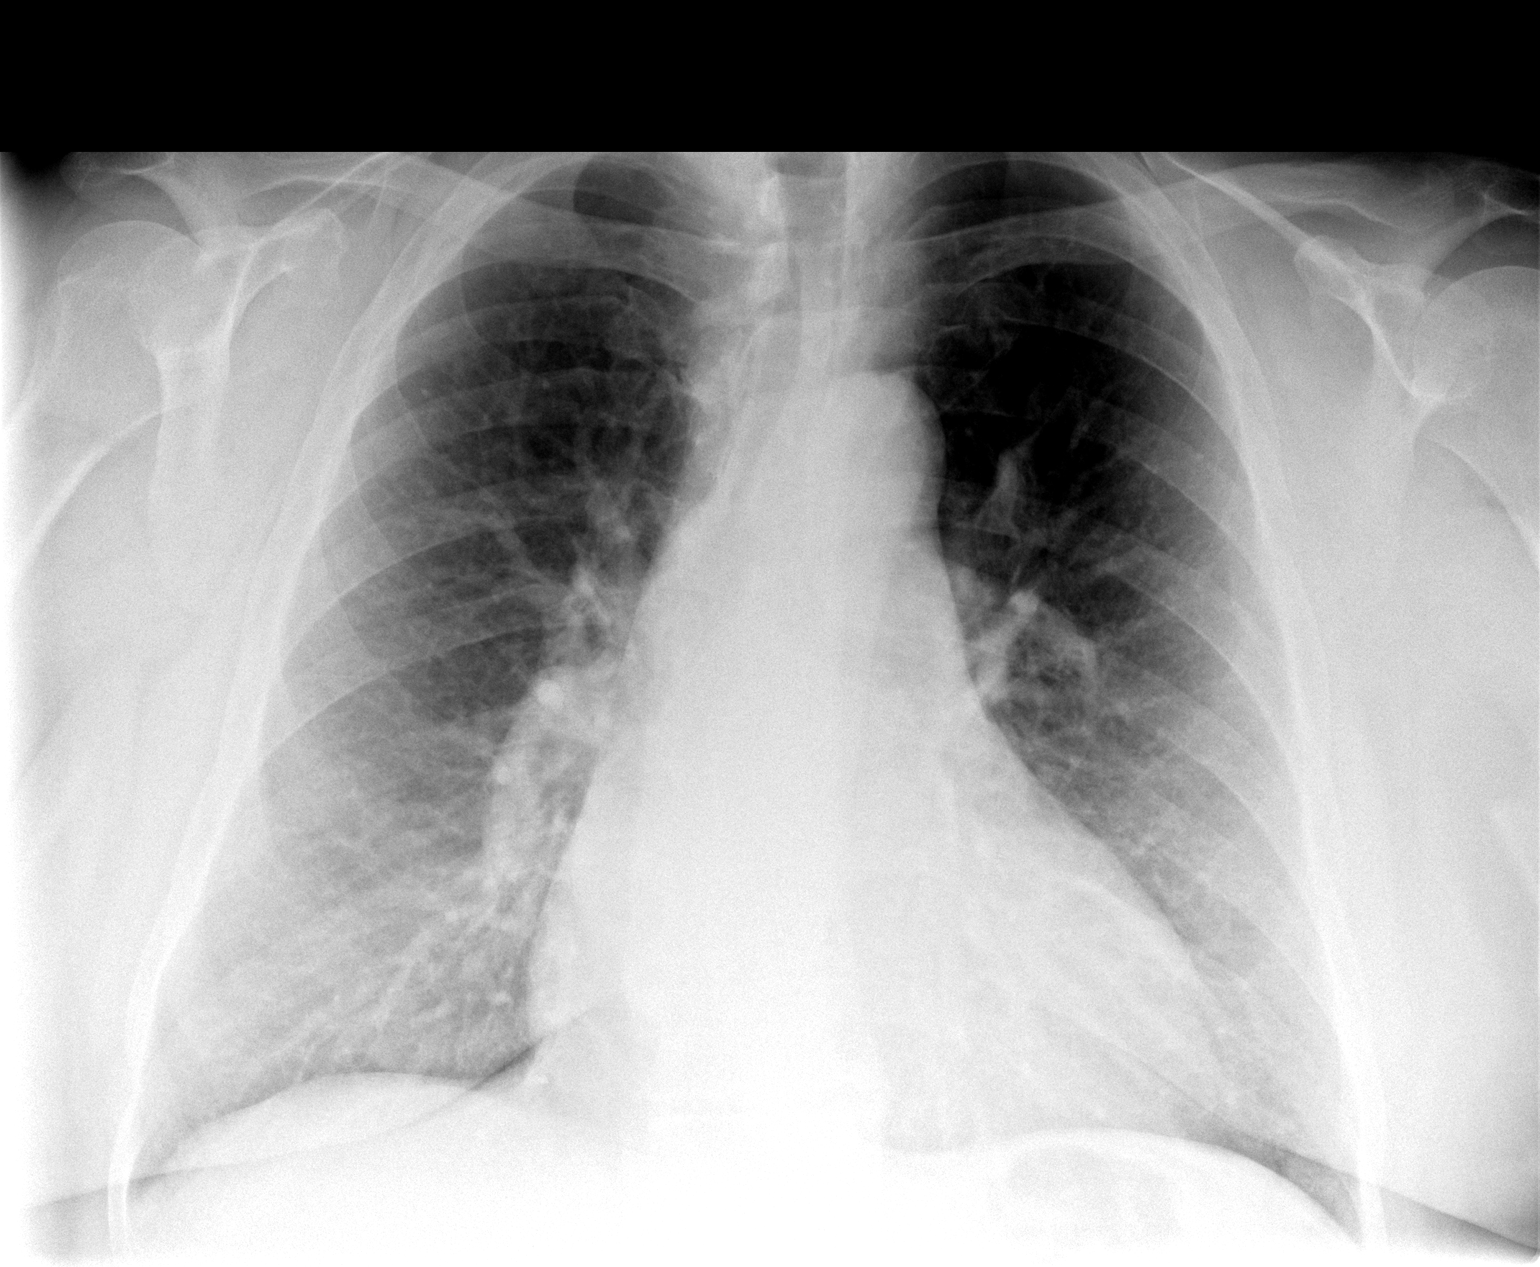

[view not recorded (2 of 2)]
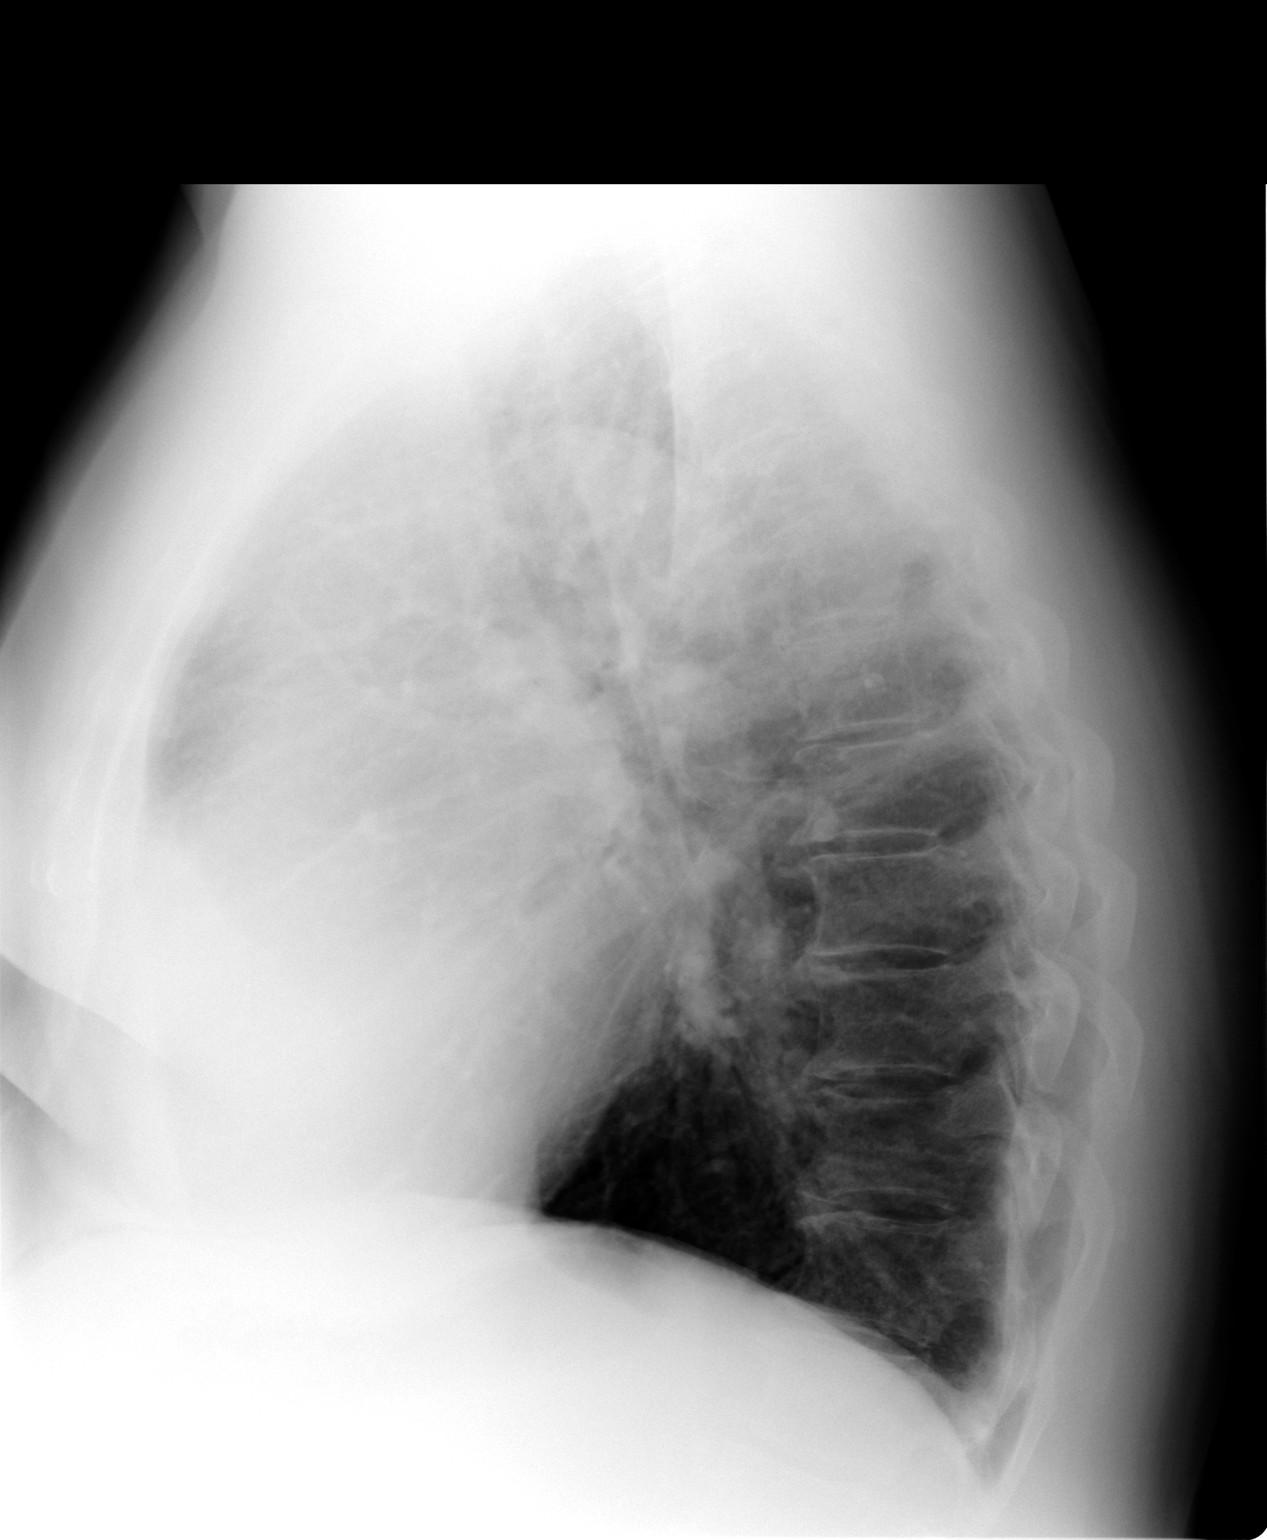

[2 of 2 positions shown; findings below may reference images not displayed]

FINDINGS: Borderline cardiomegaly.  No acute infiltrate or pleural
effusion.  No pulmonary edema.  Mild degenerative changes thoracic
spine.
IMPRESSION: Borderline cardiomegaly.  No active disease.

## 2013-07-09 DEATH — deceased

## 2013-08-02 ENCOUNTER — Telehealth: Payer: Self-pay

## 2013-08-02 NOTE — Telephone Encounter (Signed)
Patient past away per obituary
